# Patient Record
Sex: Male | Born: 1965 | Race: Black or African American | Hispanic: No | Smoking: Never smoker
Health system: Southern US, Community
[De-identification: ages and names within clinical notes are randomized; demographics above are authoritative.]

## PROBLEM LIST (undated history)

## (undated) DIAGNOSIS — J45909 Unspecified asthma, uncomplicated: Secondary | ICD-10-CM

## (undated) DIAGNOSIS — E7800 Pure hypercholesterolemia, unspecified: Secondary | ICD-10-CM

## (undated) DIAGNOSIS — E119 Type 2 diabetes mellitus without complications: Secondary | ICD-10-CM

## (undated) DIAGNOSIS — I10 Essential (primary) hypertension: Secondary | ICD-10-CM

---

## 2007-11-13 ENCOUNTER — Other Ambulatory Visit: Payer: Self-pay

## 2007-11-13 ENCOUNTER — Inpatient Hospital Stay: Payer: Self-pay | Admitting: Internal Medicine

## 2008-06-11 ENCOUNTER — Emergency Department: Payer: Self-pay | Admitting: Emergency Medicine

## 2010-12-14 ENCOUNTER — Emergency Department: Payer: Self-pay | Admitting: Emergency Medicine

## 2011-02-07 ENCOUNTER — Emergency Department: Payer: Self-pay | Admitting: Emergency Medicine

## 2012-05-08 ENCOUNTER — Emergency Department: Payer: Self-pay | Admitting: *Deleted

## 2012-05-08 LAB — BASIC METABOLIC PANEL
Calcium, Total: 8.9 mg/dL (ref 8.5–10.1)
Chloride: 105 mmol/L (ref 98–107)
Co2: 25 mmol/L (ref 21–32)
Glucose: 109 mg/dL — ABNORMAL HIGH (ref 65–99)
Osmolality: 276 (ref 275–301)
Potassium: 3.2 mmol/L — ABNORMAL LOW (ref 3.5–5.1)
Sodium: 138 mmol/L (ref 136–145)

## 2012-05-08 LAB — CBC
HCT: 42.4 % (ref 40.0–52.0)
HGB: 14.5 g/dL (ref 13.0–18.0)
MCHC: 34.2 g/dL (ref 32.0–36.0)
MCV: 91 fL (ref 80–100)
RDW: 13.7 % (ref 11.5–14.5)

## 2012-05-08 LAB — TROPONIN I: Troponin-I: 0.03 ng/mL

## 2012-10-15 ENCOUNTER — Observation Stay: Payer: Self-pay | Admitting: Internal Medicine

## 2012-10-15 LAB — COMPREHENSIVE METABOLIC PANEL
Alkaline Phosphatase: 49 U/L — ABNORMAL LOW (ref 50–136)
Anion Gap: 8 (ref 7–16)
Bilirubin,Total: 0.4 mg/dL (ref 0.2–1.0)
Chloride: 105 mmol/L (ref 98–107)
Creatinine: 1.05 mg/dL (ref 0.60–1.30)
EGFR (African American): >60
Glucose: 111 mg/dL — ABNORMAL HIGH (ref 65–99)
Osmolality: 273 (ref 275–301)
Potassium: 3.8 mmol/L (ref 3.5–5.1)
SGOT(AST): 20 U/L (ref 15–37)
Sodium: 136 mmol/L (ref 136–145)
Total Protein: 7.6 g/dL (ref 6.4–8.2)

## 2012-10-15 LAB — CK TOTAL AND CKMB (NOT AT ARMC)
CK, Total: 216 U/L (ref 35–232)
CK-MB: 0.9 ng/mL (ref 0.5–3.6)
CK-MB: 0.9 ng/mL (ref 0.5–3.6)

## 2012-10-15 LAB — CBC
HCT: 41.6 % (ref 40.0–52.0)
MCV: 92 fL (ref 80–100)
Platelet: 231 10*3/uL (ref 150–440)
RDW: 13.7 % (ref 11.5–14.5)

## 2012-10-15 LAB — TROPONIN I
Troponin-I: 0.02 ng/mL
Troponin-I: 0.03 ng/mL
Troponin-I: 0.03 ng/mL

## 2012-10-15 LAB — LIPID PANEL
Cholesterol: 179 mg/dL (ref 0–200)
Ldl Cholesterol, Calc: 121 mg/dL — ABNORMAL HIGH (ref 0–100)
Triglycerides: 95 mg/dL (ref 0–200)

## 2012-10-15 LAB — PRO B NATRIURETIC PEPTIDE: B-Type Natriuretic Peptide: 35 pg/mL (ref 0–125)

## 2013-10-16 ENCOUNTER — Emergency Department: Payer: Self-pay | Admitting: Emergency Medicine

## 2014-12-24 NOTE — H&P (Signed)
PATIENT NAME:  Bridges, [name] MR#:  [id number] DATE OF BIRTH:  02/09/1966  DATE OF ADMISSION:  10/15/2012  REFERRING PHYSICIAN: [place] SinkJade J. Dolores FrameSung, MD   PRIMARY CARE PHYSICIAN: [facility].   CHIEF COMPLAINT: Chest pain.   HISTORY OF PRESENT ILLNESS: This is a 49 year old male with significant past medical history of diabetes mellitus, on metformin, hypertension, asthma, on p.r.n. albuterol, who presents with complaints of chest pain. The patient reports the other day while he was walking, he had some shortness of breath, which he thought it was related to his asthma, where he took some inhaler, which helped him. He reports at around 2:00 this morning, he had midsternal chest pain, pressure quality, nonradiating, which woke him up from sleep. The patient describes the chest pain as pressure and tightness, nonradiating. Denies any nausea, vomiting, sweating. Reports shortness of breath, mild, accompanied with it, as well reports chest pain is worsened when he takes a deep breath. Denies palpitation, cough, productive sputum, weakness or dizziness. The patient took 324 of aspirin at home. In the ED, the patient had negative troponin, no significant EKG changes. Had some minimal wheezing, which resolved with albuterol treatment. The patient reports family history where his dad had MI at young age, so out of concern of him having unstable angina, the patient was given subcutaneous Lovenox treatment dose in ED, where hospitalist service was requested to admit the patient for further workup of his chest pain   PAST MEDICAL HISTORY:  1. Asthma.  2. Hypertension.  3. Seasonal allergies. 4. Diabetes mellitus.   PAST SURGICAL HISTORY: None.   SOCIAL HISTORY: The patient does not smoke. Drinks beer occasionally.   FAMILY HISTORY: Significant for Alzheimer's for his father as well as history of MI and pacemaker at young age, reports in his 1050s. Also significant for hypertension in his mother.    ALLERGIES: No known drug allergies.   HOME MEDICATIONS:  1. Albuterol as needed.  2. Accupril 10 mg oral daily.  3. Metformin 500 mg 3 tablets daily.  4. Loratadine 10 mg oral daily.  5. Albuterol inhalation every 6 hours as needed.   PHYSICAL EXAMINATION: VITAL SIGNS: Pulse 84, respiratory rate 22, blood pressure 183/110, saturating 94% on room air, last blood pressure 126/71.  GENERAL: Well-nourished male, looks comfortable in bed, in no apparent distress.  HEENT: Head atraumatic, normocephalic. Pupils are equal and reactive to light. Pink conjunctivae. Anicteric sclerae. Moist oral mucosa.  NECK: Supple. No thyromegaly. No JVD.  CHEST: Good air entry bilaterally. No wheezing, rales or rhonchi.  CARDIOVASCULAR: S1, S2 heard. No rubs, murmurs or gallops.  ABDOMEN: Soft, nontender, nondistended. Bowel sounds present.  EXTREMITIES: No edema. No clubbing. No cyanosis.  PSYCHIATRIC: Appropriate affect. Awake and alert x3. Intact judgment and insight.  NEUROLOGIC: Cranial nerves grossly intact. Motor 5 out of 5.  SKIN: Normal skin turgor. Warm and dry. No rash.   PERTINENT LABORATORY: Glucose 111, BUN 13, creatinine 1.05, sodium 136, potassium 3.8, chloride 105, CO2 23, alkaline phosphatase 49, ALT 22, AST 20. Troponin 0.02, CK-MB 0.9, total CK 240. White blood cells 8.9, hemoglobin 14.1, hematocrit 41.6, platelets 231. D-dimer 0.23. EKG showing normal sinus rhythm without any significant ST or T wave changes once compared to old EKGs.   ASSESSMENT AND PLAN:  1. Chest pain. The patient was given subcutaneous Lovenox in the ED out of concern for unstable angina, already took 324 of aspirin at home. The chest pain worsened with deep inspiration, so unlikely cardiac, but  given his risk factors and family history, will cycle his troponins, and if negative, will schedule him for a stress test.  2. Hypertension. Will continue with home medications, currently controlled.  3. Asthma. Has some very  minimal wheezing. Will continue him on p.r.n. albuterol.  4. Diabetes mellitus. Will hold metformin. Will have him on insulin sliding scale.  5. Deep vein thrombosis prophylaxis. Already received treatment dose of Lovenox.  6. Gastrointestinal prophylaxis. Will have him on Protonix.   CODE STATUS: Full code.   TOTAL TIME SPENT ON ADMISSION AND PATIENT CARE: 50 minutes.   ____________________________ Starleen Arms, MD dse:OSi D: 10/15/2012 06:43:16 ET T: 10/15/2012 07:13:46 ET JOB#: [id number]  cc: Starleen Arms, MD, <Dictator> Tresten Pantoja Teena Irani MD ELECTRONICALLY SIGNED 10/18/2012 4:58

## 2014-12-24 NOTE — Discharge Summary (Signed)
PATIENT NAME:  Ronald Bridges, Ronald Bridges MR#:  [id number] DATE OF BIRTH:  [birth year]  DATE OF ADMISSION:  10/15/2012 DATE OF DISCHARGE:  10/15/2012  [facility]   DISCHARGE DIAGNOSES: 1. Chest pain, stress test negative.  2. Hypertension. 3. Diabetes.  4. Gastroesophageal reflux disease.   HISTORY OF PRESENT ILLNESS:  This is a 49 year old male with significant past medical history of diabetes mellitus on metformin, hypertension and asthma, on p.r.n. albuterol, who presented with complaint of chest pain. Per patient's report, while he was walking, he had some shortness of breath. He thought it was related to asthma, took some inhalers which helped him. Reported that around 2 in the morning he had midsternal chest pain, pressure quality and nonradiating which woke him up from sleep. Described the chest pain as pressure and tightness-type, nonradiating. Denied any nausea, vomiting or sweating. Reported shortness of breath, mild, accompanied with it, and pain worsened when he took some deep breaths. Denied any palpitation, cough, productive sputum, weakness or dizziness. He took 324 mg of aspirin at home. In the ER, he had negative troponin and no significant EKG changes, had some minimal wheezing, which resolved with albuterol treatment. The patient had family history of MI.  His father died at young age. So because of concern of having unstable angina, the patient was given subcutaneous Lovenox, treatment dose, in the Emergency Room, and hospitalist service was contacted for further workup. As the patient had not had anything since night, initially the plan was to do the stress test the next day morning, but then the patient was not having any chest pain at that time, and he was n.p.o., so I spoke to the stress test department and they were ready to do it on the same day. We followed his 3 troponins over the next 8 hour period of time and they all remained negative,  0.02, 0.03 and 0.03. Stress test was done on the same day, and I spoke to Dr. Darrold JunkerParaschos on the phone. As per him, the stress test was negative for any ischemia and so we discharged the patient home without any change in his medication, just added some medication for his gastroesophageal reflux disease, which might be possible for his pain.   Other medical issues addressed during this hospital stay:  1. Hypertension. We continued his home medication.  2. Asthma. There is some very minimal wheezing, had some very minimal wheezing for which we continued him on p.r.n. albuterol.  3. Diabetes mellitus. We started on metformin and discharged on the same.   IMPORTANT LAB RESULTS: LDL was slightly elevated at 121. Troponins, all 3, negative.   CONDITION ON DISCHARGE: Stable.   CODE STATUS: FULL CODE.   DISCHARGE MEDICATIONS:  1. Loratadine 10 mg oral tablet once a day. 2. Metformin 500 mg oral tablet once a day. 3. Accupril 10 mg oral tablet once a day. 4. Albuterol inhaler every 6 hours as needed for wheezing.  5. Pantoprazole 40 mg oral delayed-release tablet once a day.   HOME HEALTH ON DISCHARGE: No.   DIET CONSISTENCY ON DISCHARGE: Regular.   DIET ON DISCHARGE: Low sodium, low carbohydrate-controlled diet.  TIMEFRAME TO FOLLOWUP: In 1 to 2 weeks and follow up with PMD as routine.   TOTAL TIME SPENT IN THIS DISCHARGE: 45 minutes. ____________________________ [name], MD vgv:sb D: 10/19/2012 14:09:26 ET T: 10/20/2012 08:54:34 ET JOB#: [id number]  cc: [name], MD, <Dictator> [facility] Altamese DillingVAIBHAVKUMAR Elige Shouse MD  ELECTRONICALLY SIGNED 10/24/2012 18:19

## 2015-07-31 ENCOUNTER — Emergency Department
Admission: EM | Admit: 2015-07-31 | Discharge: 2015-07-31 | Disposition: A | Discharge status: Home / Self Care | Payer: Self-pay | Attending: Emergency Medicine | Admitting: Emergency Medicine

## 2015-07-31 ENCOUNTER — Encounter: Payer: Self-pay | Admitting: Emergency Medicine

## 2015-07-31 DIAGNOSIS — E119 Type 2 diabetes mellitus without complications: Secondary | ICD-10-CM | POA: Insufficient documentation

## 2015-07-31 DIAGNOSIS — K029 Dental caries, unspecified: Secondary | ICD-10-CM | POA: Insufficient documentation

## 2015-07-31 DIAGNOSIS — K047 Periapical abscess without sinus: Secondary | ICD-10-CM | POA: Insufficient documentation

## 2015-07-31 DIAGNOSIS — I10 Essential (primary) hypertension: Secondary | ICD-10-CM | POA: Insufficient documentation

## 2015-07-31 HISTORY — DX: Essential (primary) hypertension: I10

## 2015-07-31 HISTORY — DX: Unspecified asthma, uncomplicated: J45.909

## 2015-07-31 HISTORY — DX: Pure hypercholesterolemia, unspecified: E78.00

## 2015-07-31 HISTORY — DX: Type 2 diabetes mellitus without complications: E11.9

## 2015-07-31 MED ORDER — AMOXICILLIN 875 MG PO TABS: 875.0000 mg | ORAL_TABLET | Freq: Two times a day (BID) | ORAL | Status: DC

## 2015-07-31 MED ORDER — MAGIC MOUTHWASH W/LIDOCAINE: 5.0000 mL | Freq: Four times a day (QID) | ORAL | Status: DC

## 2015-07-31 MED ORDER — HYDROCODONE-ACETAMINOPHEN 5-325 MG PO TABS: 1.0000 | ORAL_TABLET | ORAL | Status: DC | PRN

## 2015-07-31 NOTE — Discharge Instructions (Signed)

## 2015-07-31 NOTE — ED Notes (Signed)
Patient presents to the ED with upper left sided tooth pain and swelling to the left side of his face.  Patient denies nausea and vomiting at this time.  Patient states pain started on Friday.  Patient denies any broken teeth to his knowledge.

## 2015-07-31 NOTE — ED Notes (Signed)
Patient states that he is having pain and swelling on the right side of his mouth for 3 days.

## 2015-07-31 NOTE — ED Provider Notes (Signed)
[facility] Emergency Department Provider Note  ____________________________________________  Time seen: Approximately 9:22 AM  I have reviewed the triage vital signs and the nursing notes.   HISTORY  Chief Complaint Dental Pain    HPI Ronald Bridges is a 49 y.o. male resents emergency department complaining of left upper jaw/tooth pain and swelling for 3 days. He states that symptoms began insidiously and has increased significantly over the last 24 hours. He denies any dental trauma that he knows of. He does endorse some poor dentition to include multiple cavities. Patient denies any fevers or chills, nausea or vomiting, difficulty breathing or swallowing. He states the pain is moderate to severe, constant, worse with mastication area over the counter medications have not alleviated same.   Past Medical History  Diagnosis Date  . Asthma   . Diabetes mellitus without complication (HCC)   . Hypertension   . High cholesterol     There are no active problems to display for this patient.   History reviewed. No pertinent past surgical history.  Current Outpatient Rx  Name  Route  Sig  Dispense  Refill  . amoxicillin (AMOXIL) 875 MG tablet   Oral   Take 1 tablet (875 mg total) by mouth 2 (two) times daily.   14 tablet   0   . HYDROcodone-acetaminophen (NORCO/VICODIN) 5-325 MG tablet   Oral   Take 1 tablet by mouth every 4 (four) hours as needed for moderate pain.   10 tablet   0   . magic mouthwash w/lidocaine SOLN   Oral   Take 5 mLs by mouth 4 (four) times daily.   240 mL   0     Dispense in a 1/1/1/1 ratio     Allergies Review of patient's allergies indicates no known allergies.  No family history on file.  Social History Social History  Substance Use Topics  . Smoking status: Never Smoker   . Smokeless tobacco: None  . Alcohol Use: No    Review of Systems Constitutional: No fever/chills Eyes: No visual changes. ENT: No  sore throat. Cardiovascular: Denies chest pain. Respiratory: Denies shortness of breath. Gastrointestinal: No abdominal pain.  No nausea, no vomiting.  No diarrhea.  No constipation. Genitourinary: Negative for dysuria. Musculoskeletal: Negative for back pain. Skin: Negative for rash. Neurological: Negative for headaches, focal weakness or numbness.  10-point ROS otherwise negative.  ____________________________________________   PHYSICAL EXAM:  VITAL SIGNS: ED Triage Vitals  Enc Vitals Group     BP 07/31/15 0915 168/95 mmHg     Pulse Rate 07/31/15 0915 86     Resp 07/31/15 0915 18     Temp 07/31/15 0915 98.9 F (37.2 C)     Temp Source 07/31/15 0915 Oral     SpO2 07/31/15 0915 96 %     Weight 07/31/15 0915 280 lb (127.007 kg)     Height 07/31/15 0915 6\' 4"  (1.93 m)     Head Cir --      Peak Flow --      Pain Score 07/31/15 0918 8     Pain Loc --      Pain Edu? --      Excl. in GC? --     Constitutional: Alert and oriented. Well appearing and in no acute distress. Eyes: Conjunctivae are normal. PERRL. EOMI. Head: Atraumatic. Nose: No congestion/rhinnorhea. Mouth/Throat: Mucous membranes are moist.  Oropharynx non-erythematous. Dental caries are observed and multiple teeth. Surrounding erythema and edema to the first and  second molars on the left upper dentition. Extreme tenderness to palpation with tongue depressor. No fluctuance with palpation. No drainage noted. Neck: No stridor.   Hematological/Lymphatic/Immunilogical: No cervical lymphadenopathy. Cardiovascular: Normal rate, regular rhythm. Grossly normal heart sounds.  Good peripheral circulation. Respiratory: Normal respiratory effort.  No retractions. Lungs CTAB. Gastrointestinal: Soft and nontender. No distention. No abdominal bruits. No CVA tenderness. Musculoskeletal: No lower extremity tenderness nor edema.  No joint effusions. Neurologic:  Normal speech and language. No gross focal neurologic deficits are  appreciated. No gait instability. Skin:  Skin is warm, dry and intact. No rash noted. Psychiatric: Mood and affect are normal. Speech and behavior are normal.  ____________________________________________   LABS (all labs ordered are listed, but only abnormal results are displayed)  Labs Reviewed - No data to display ____________________________________________  EKG   ____________________________________________  RADIOLOGY   ____________________________________________   PROCEDURES  Procedure(s) performed: None  Critical Care performed: No  ____________________________________________   INITIAL IMPRESSION / ASSESSMENT AND PLAN / ED COURSE  Pertinent labs & imaging results that were available during my care of the patient were reviewed by me and considered in my medical decision making (see chart for details).  Patient's history, symptoms, physical exam are consistent with dental abscess to the left upper dentition. Advised patient of findings and diagnosis and he verbalizes understanding of same. The patient was placed on amoxicillin, Norco, and magic mouthwash for symptomatic control. Advised the patient to follow-up with dentist for any further evaluation and treatment. Patient verbalizes understanding of same and verbalizes compliance with treatment plan. ____________________________________________   FINAL CLINICAL IMPRESSION(S) / ED DIAGNOSES  Final diagnoses:  Dental abscess      Racheal Patches, PA-C 07/31/15 0935  Phineas Semen, MD 07/31/15 [id number]

## 2016-03-18 ENCOUNTER — Emergency Department
Admission: EM | Admit: 2016-03-18 | Discharge: 2016-03-18 | Disposition: A | Discharge status: Home / Self Care | Payer: Self-pay | Attending: Emergency Medicine | Admitting: Emergency Medicine

## 2016-03-18 ENCOUNTER — Emergency Department: Payer: Self-pay

## 2016-03-18 ENCOUNTER — Encounter: Payer: Self-pay | Admitting: Emergency Medicine

## 2016-03-18 DIAGNOSIS — Y999 Unspecified external cause status: Secondary | ICD-10-CM | POA: Insufficient documentation

## 2016-03-18 DIAGNOSIS — Y9389 Activity, other specified: Secondary | ICD-10-CM | POA: Insufficient documentation

## 2016-03-18 DIAGNOSIS — I10 Essential (primary) hypertension: Secondary | ICD-10-CM | POA: Insufficient documentation

## 2016-03-18 DIAGNOSIS — W268XXA Contact with other sharp object(s), not elsewhere classified, initial encounter: Secondary | ICD-10-CM | POA: Insufficient documentation

## 2016-03-18 DIAGNOSIS — Z7984 Long term (current) use of oral hypoglycemic drugs: Secondary | ICD-10-CM | POA: Insufficient documentation

## 2016-03-18 DIAGNOSIS — S61218A Laceration without foreign body of other finger without damage to nail, initial encounter: Secondary | ICD-10-CM

## 2016-03-18 DIAGNOSIS — S61212A Laceration without foreign body of right middle finger without damage to nail, initial encounter: Secondary | ICD-10-CM | POA: Insufficient documentation

## 2016-03-18 DIAGNOSIS — Z79899 Other long term (current) drug therapy: Secondary | ICD-10-CM | POA: Insufficient documentation

## 2016-03-18 DIAGNOSIS — J45909 Unspecified asthma, uncomplicated: Secondary | ICD-10-CM | POA: Insufficient documentation

## 2016-03-18 DIAGNOSIS — E119 Type 2 diabetes mellitus without complications: Secondary | ICD-10-CM | POA: Insufficient documentation

## 2016-03-18 DIAGNOSIS — IMO0001 Reserved for inherently not codable concepts without codable children: Secondary | ICD-10-CM

## 2016-03-18 DIAGNOSIS — Y929 Unspecified place or not applicable: Secondary | ICD-10-CM | POA: Insufficient documentation

## 2016-03-18 MED ORDER — LIDOCAINE HCL (PF) 1 % IJ SOLN
2.0000 mL | Freq: Once | INTRAMUSCULAR | Status: AC
  Administered 2016-03-18: 2 mL

## 2016-03-18 MED ORDER — HYDROCODONE-ACETAMINOPHEN 5-325 MG PO TABS: 1.0000 | ORAL_TABLET | Freq: Four times a day (QID) | ORAL | Status: DC | PRN

## 2016-03-18 MED ORDER — CEPHALEXIN 500 MG PO CAPS
500.0000 mg | ORAL_CAPSULE | Freq: Once | ORAL | Status: AC
  Administered 2016-03-18: 500 mg via ORAL
  Filled 2016-03-18: qty 1

## 2016-03-18 MED ORDER — CEPHALEXIN 500 MG PO CAPS: 500.0000 mg | ORAL_CAPSULE | Freq: Four times a day (QID) | ORAL | Status: AC

## 2016-03-18 MED ORDER — TETANUS-DIPHTH-ACELL PERTUSSIS 5-2.5-18.5 LF-MCG/0.5 IM SUSP
0.5000 mL | Freq: Once | INTRAMUSCULAR | Status: AC
  Administered 2016-03-18: 0.5 mL via INTRAMUSCULAR
  Filled 2016-03-18: qty 0.5

## 2016-03-18 NOTE — Discharge Instructions (Signed)
Laceration Care, Adult  A laceration is a cut that goes through all layers of the skin. The cut also goes into the tissue that is right under the skin. Some cuts heal on their own. Others need to be closed with stitches (sutures), staples, skin adhesive strips, or wound glue. Taking care of your cut lowers your risk of infection and helps your cut to heal better.  HOW TO TAKE CARE OF YOUR CUT  For stitches or staples:  · Keep the wound clean and dry.  · If you were given a bandage (dressing), you should change it at least one time per day or as told by your doctor. You should also change it if it gets wet or dirty.  · Keep the wound completely dry for the first 24 hours or as told by your doctor. After that time, you may take a shower or a bath. However, make sure that the wound is not soaked in water until after the stitches or staples have been removed.  · Clean the wound one time each day or as told by your doctor:    Wash the wound with soap and water.    Rinse the wound with water until all of the soap comes off.    Pat the wound dry with a clean towel. Do not rub the wound.  · After you clean the wound, put a thin layer of antibiotic ointment on it as told by your doctor. This ointment:    Helps to prevent infection.    Keeps the bandage from sticking to the wound.  · Have your stitches or staples removed as told by your doctor.  If your doctor used skin adhesive strips:   · Keep the wound clean and dry.  · If you were given a bandage, you should change it at least one time per day or as told by your doctor. You should also change it if it gets dirty or wet.  · Do not get the skin adhesive strips wet. You can take a shower or a bath, but be careful to keep the wound dry.  · If the wound gets wet, pat it dry with a clean towel. Do not rub the wound.  · Skin adhesive strips fall off on their own. You can trim the strips as the wound heals. Do not remove any strips that are still stuck to the wound. They will  fall off after a while.  If your doctor used wound glue:  · Try to keep your wound dry, but you may briefly wet it in the shower or bath. Do not soak the wound in water, such as by swimming.  · After you take a shower or a bath, gently pat the wound dry with a clean towel. Do not rub the wound.  · Do not do any activities that will make you really sweaty until the skin glue has fallen off on its own.  · Do not apply liquid, cream, or ointment medicine to your wound while the skin glue is still on.  · If you were given a bandage, you should change it at least one time per day or as told by your doctor. You should also change it if it gets dirty or wet.  · If a bandage is placed over the wound, do not let the tape for the bandage touch the skin glue.  · Do not pick at the glue. The skin glue usually stays on for 5-10 days. Then, it   or when wound glue stays in place and the wound is healed. Make sure to wear a sunscreen of at least 30 SPF.  Take over-the-counter and prescription medicines only as told by your doctor.  If you were given antibiotic medicine or ointment, take or apply it as told by your doctor. Do not stop using the antibiotic even if your wound is getting better.  Do not scratch or pick at the wound.  Keep all follow-up visits as told by your doctor. This is important.  Check your wound every day for signs of infection. Watch for:  Redness, swelling, or pain.  Fluid, blood, or pus.  Raise (elevate) the injured area above the level of your heart while you are sitting or lying down, if possible. GET HELP IF:  You got a tetanus shot and you have any of these problems at the injection site:  Swelling.  Very bad pain.  Redness.  Bleeding.  You have a fever.  A wound that was  closed breaks open.  You notice a bad smell coming from your wound or your bandage.  You notice something coming out of the wound, such as wood or glass.  Medicine does not help your pain.  You have more redness, swelling, or pain at the site of your wound.  You have fluid, blood, or pus coming from your wound.  You notice a change in the color of your skin near your wound.  You need to change the bandage often because fluid, blood, or pus is coming from the wound.  You start to have a new rash.  You start to have numbness around the wound. GET HELP RIGHT AWAY IF:  You have very bad swelling around the wound.  Your pain suddenly gets worse and is very bad.  You notice painful lumps near the wound or on skin that is anywhere on your body.  You have a red streak going away from your wound.  The wound is on your hand or foot and you cannot move a finger or toe like you usually can.  The wound is on your hand or foot and you notice that your fingers or toes look pale or bluish.   This information is not intended to replace advice given to you by your health care provider. Make sure you discuss any questions you have with your health care provider.   Document Released: 02/06/2008 Document Revised: 01/04/2015 Document Reviewed: 08/16/2014 Elsevier Interactive Patient Education 2016 [employer]   Please keep the incision site clean and dry for the next 48 hours. Change dressing as needed every 24 hours after the first 2 days. Soak in half warm water, half peroxide daily. Take antibiotics as prescribed. Follow-up in 8-10 days for suture removal.

## 2016-03-18 NOTE — ED Notes (Signed)
Patient presents to the ED with a laceration to his right forefinger.  Patient states, "I wasn't thinking and I tried to get a piece of grass out of the lawnmower with my finger.  Area is slowly bleeding at this time.  Patient is in no obvious distress at this time.

## 2016-03-18 NOTE — ED Provider Notes (Signed)
CSN: [id number]     Arrival date & time 03/18/16  1250 History   First MD Initiated Contact with Patient 03/18/16 1337     Chief Complaint  Patient presents with  . Extremity Laceration     (Consider location/radiation/quality/duration/timing/severity/associated sxs/prior Treatment) HPI  50 year old male presents to emergency department for evaluation of right index finger laceration. Patient states just prior to arrival he was cleaning out his lawnmower while the blade was still running, the bleeding cut the tip of his right middle finger. Patient suffered 2 lacerations to the distal phalanx. Patient's pain is moderate. No numbness or tingling. Tetanus is not up-to-date. He is not had any medications for pain.  Past Medical History  Diagnosis Date  . Asthma   . Diabetes mellitus without complication (HCC)   . Hypertension   . High cholesterol    History reviewed. No pertinent past surgical history. No family history on file. Social History  Substance Use Topics  . Smoking status: Never Smoker   . Smokeless tobacco: None  . Alcohol Use: No    Review of Systems  Constitutional: Negative.  Negative for fever, chills, activity change and appetite change.  HENT: Negative for congestion, ear pain, mouth sores, rhinorrhea, sinus pressure, sore throat and trouble swallowing.   Eyes: Negative for photophobia, pain and discharge.  Respiratory: Negative for cough, chest tightness and shortness of breath.   Cardiovascular: Negative for chest pain and leg swelling.  Gastrointestinal: Negative for nausea, vomiting, abdominal pain, diarrhea and abdominal distention.  Genitourinary: Negative for dysuria and difficulty urinating.  Musculoskeletal: Negative for back pain, arthralgias and gait problem.  Skin: Positive for wound. Negative for color change and rash.  Neurological: Negative for dizziness and headaches.  Hematological: Negative for adenopathy.  Psychiatric/Behavioral: Negative for  behavioral problems and agitation.      Allergies  Review of patient's allergies indicates no known allergies.  Home Medications   Prior to Admission medications   Medication Sig Start Date End Date Taking? Authorizing Provider  albuterol (PROVENTIL HFA;VENTOLIN HFA) 108 (90 Base) MCG/ACT inhaler Inhale 2 puffs into the lungs every 6 (six) hours as needed for wheezing or shortness of breath.   Yes Historical Provider, MD  atorvastatin (LIPITOR) 20 MG tablet Take 20 mg by mouth daily.   Yes Historical Provider, MD  beclomethasone (QVAR) 80 MCG/ACT inhaler Inhale into the lungs 2 (two) times daily.   Yes Historical Provider, MD  glipiZIDE (GLUCOTROL) 10 MG tablet Take 10 mg by mouth daily before breakfast.   Yes Historical Provider, MD  metFORMIN (GLUCOPHAGE) 500 MG tablet Take by mouth 2 (two) times daily with a meal.   Yes Historical Provider, MD  montelukast (SINGULAIR) 10 MG tablet Take 10 mg by mouth at bedtime.   Yes Historical Provider, MD  amoxicillin (AMOXIL) 875 MG tablet Take 1 tablet (875 mg total) by mouth 2 (two) times daily. 07/31/15   Delorise Royals Cuthriell, PA-C  cephALEXin (KEFLEX) 500 MG capsule Take 1 capsule (500 mg total) by mouth 4 (four) times daily. 7 days 03/18/16 03/28/16  Evon Slack, PA-C  HYDROcodone-acetaminophen (NORCO) 5-325 MG tablet Take 1 tablet by mouth every 6 (six) hours as needed for moderate pain. 03/18/16   Evon Slack, PA-C  HYDROcodone-acetaminophen (NORCO/VICODIN) 5-325 MG tablet Take 1 tablet by mouth every 4 (four) hours as needed for moderate pain. 07/31/15   Delorise Royals Cuthriell, PA-C  magic mouthwash w/lidocaine SOLN Take 5 mLs by mouth 4 (four) times daily. 07/31/15  Delorise Royals Cuthriell, PA-C   BP 150/94 mmHg  Pulse 93  Temp(Src) 98.2 F (36.8 C) (Oral)  Resp 16  Ht 6\' 4"  (1.93 m)  Wt 124.739 kg  BMI 33.49 kg/m2  SpO2 95% Physical Exam  Constitutional: He is oriented to person, place, and time. He appears well-developed and  well-nourished.  HENT:  Head: Normocephalic and atraumatic.  Eyes: Conjunctivae and EOM are normal. Pupils are equal, round, and reactive to light.  Neck: Normal range of motion. Neck supple.  Cardiovascular: Normal rate, regular rhythm, normal heart sounds and intact distal pulses.   Pulmonary/Chest: Effort normal and breath sounds normal. No respiratory distress. He has no wheezes. He has no rales. He exhibits no tenderness.  Abdominal: Soft. Bowel sounds are normal. He exhibits no distension. There is no tenderness.  Musculoskeletal:  Examination of the right hand shows the patient has lacerations to the tip of the right middle finger along the distal phalanx. Patient has a volar and dorsal laceration, approximately 2 cm in length each. There is no sign of foreign body. Wound was thoroughly irrigated with Betadine and saline. Patient has sensation that is intact distally on the ulnar and radial aspect of the finger. Extensor and flexor tendons are intact at the DIP and PIP joints. Nail is intact.  Neurological: He is alert and oriented to person, place, and time.  Skin: Skin is warm and dry.  Psychiatric: He has a normal mood and affect. His behavior is normal. Judgment and thought content normal.    ED Course  Procedures (including critical care time) LACERATION REPAIR Performed by: Patience Musca Authorized by: Patience Musca Consent: Verbal consent obtained. Risks and benefits: risks, benefits and alternatives were discussed Consent given by: patient Patient identity confirmed: provided demographic data Prepped and Draped in normal sterile fashion Wound explored  Laceration Location: Right middle finger, distal phalanx  Laceration Length: 4 cm  No Foreign Bodies seen or palpated  Anesthesia: local infiltration  Local anesthetic: lidocaine 1 % without epinephrine  Anesthetic total: 2 ml digital block procedure   Irrigation method: syringe Amount of  cleaning: standard  Skin closure: Simple interrupted 5-0 nylon   Number of sutures: #10 sutures   Technique: Simple interrupted   Patient tolerance: Patient tolerated the procedure well with no immediate complications. Labs Review Labs Reviewed - No data to display  Imaging Review Dg Finger Middle Right  03/18/2016  CLINICAL DATA:  Laceration it tip of middle finger. EXAM: RIGHT MIDDLE FINGER 2+V COMPARISON:  None. FINDINGS: Three-view exam shows radiopaque debris along the dorsal cortex of the middle finger distal phalanx. This could be related to cortical bone injury or retained radiopaque soft tissue foreign bodies. No gross fracture is evident. IMPRESSION: Tiny radiopaque material along the dorsal aspect of the distal middle finger is probably related to the degree of cortical disruption related to the laceration although retained radiopaque soft tissue foreign body could also have this appearance. Electronically Signed   By: Kennith Center M.D.   On: 03/18/2016 14:12   I have personally reviewed and evaluated these images and lab results as part of my medical decision-making.   EKG Interpretation None      MDM   Final diagnoses:  Laceration of middle finger, initial encounter    50 year old male with laceration to the right middle finger, distal phalanx. No neurovascular or tendon deficits noted. X-ray show no fracture, small radiopaque foreign body was removed during laceration repair. Sterile dressing was applied, patient  will follow-up with orthopedics in 8-10 days for suture removal. He is placed on Keflex. Tetanus was updated.    Evon Slackhomas C Gaines, PA-C 03/18/16 1451  Sharyn CreamerMark Quale, MD 03/18/16 [phone]

## 2016-03-18 NOTE — ED Notes (Signed)
Right index finger placed in betadine soak

## 2016-03-18 NOTE — ED Notes (Signed)
Laceration noted to right index finger from lawnmower  States he tried to remove grass while  was moving

## 2016-05-24 ENCOUNTER — Emergency Department
Admission: EM | Admit: 2016-05-24 | Discharge: 2016-05-24 | Disposition: A | Discharge status: Home / Self Care | Payer: Worker's Compensation | Attending: Emergency Medicine | Admitting: Emergency Medicine

## 2016-05-24 ENCOUNTER — Encounter: Payer: Self-pay | Admitting: Emergency Medicine

## 2016-05-24 DIAGNOSIS — Y92199 Unspecified place in other specified residential institution as the place of occurrence of the external cause: Secondary | ICD-10-CM | POA: Insufficient documentation

## 2016-05-24 DIAGNOSIS — T148XXA Other injury of unspecified body region, initial encounter: Secondary | ICD-10-CM

## 2016-05-24 DIAGNOSIS — S50812A Abrasion of left forearm, initial encounter: Secondary | ICD-10-CM | POA: Insufficient documentation

## 2016-05-24 DIAGNOSIS — E119 Type 2 diabetes mellitus without complications: Secondary | ICD-10-CM | POA: Diagnosis not present

## 2016-05-24 DIAGNOSIS — I10 Essential (primary) hypertension: Secondary | ICD-10-CM | POA: Insufficient documentation

## 2016-05-24 DIAGNOSIS — S50811A Abrasion of right forearm, initial encounter: Secondary | ICD-10-CM | POA: Insufficient documentation

## 2016-05-24 DIAGNOSIS — Z7984 Long term (current) use of oral hypoglycemic drugs: Secondary | ICD-10-CM | POA: Diagnosis not present

## 2016-05-24 DIAGNOSIS — J45909 Unspecified asthma, uncomplicated: Secondary | ICD-10-CM | POA: Diagnosis not present

## 2016-05-24 DIAGNOSIS — S20311A Abrasion of right front wall of thorax, initial encounter: Secondary | ICD-10-CM | POA: Diagnosis not present

## 2016-05-24 DIAGNOSIS — Y990 Civilian activity done for income or pay: Secondary | ICD-10-CM | POA: Diagnosis not present

## 2016-05-24 DIAGNOSIS — S59912A Unspecified injury of left forearm, initial encounter: Secondary | ICD-10-CM | POA: Diagnosis present

## 2016-05-24 DIAGNOSIS — Y939 Activity, unspecified: Secondary | ICD-10-CM | POA: Diagnosis not present

## 2016-05-24 MED ORDER — SULFAMETHOXAZOLE-TRIMETHOPRIM 800-160 MG PO TABS
1.0000 | ORAL_TABLET | Freq: Once | ORAL | Status: AC
  Administered 2016-05-24: 1 via ORAL
  Filled 2016-05-24: qty 1

## 2016-05-24 MED ORDER — NEOMYCIN-POLYMYXIN-PRAMOXINE 1 % EX CREA: TOPICAL_CREAM | Freq: Two times a day (BID) | CUTANEOUS | 0 refills | Status: AC

## 2016-05-24 MED ORDER — SULFAMETHOXAZOLE-TRIMETHOPRIM 800-160 MG PO TABS: 1.0000 | ORAL_TABLET | Freq: Two times a day (BID) | ORAL | 0 refills | Status: DC

## 2016-05-24 MED ORDER — NAPROXEN 500 MG PO TABS: 500.0000 mg | ORAL_TABLET | Freq: Two times a day (BID) | ORAL | Status: DC

## 2016-05-24 MED ORDER — BACITRACIN ZINC 500 UNIT/GM EX OINT
TOPICAL_OINTMENT | Freq: Two times a day (BID) | CUTANEOUS | Status: DC
  Administered 2016-05-24: 1 via TOPICAL
  Filled 2016-05-24: qty 0.9

## 2016-05-24 MED ORDER — NAPROXEN 500 MG PO TABS
500.0000 mg | ORAL_TABLET | Freq: Once | ORAL | Status: AC
  Administered 2016-05-24: 500 mg via ORAL
  Filled 2016-05-24: qty 1
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
## 2016-05-24 NOTE — ED Notes (Signed)
Workmans Comp done. UDS completed and walked specimen to the lab. Breathe Analysis was only upon request. Spoke to Merla RichesJudy Craig from [employer] and said we did not have to do it.
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
## 2016-05-24 NOTE — ED Notes (Signed)
Pt arms wrapped and bacitracin applied.

## 2016-10-07 ENCOUNTER — Emergency Department: Payer: Self-pay

## 2016-10-07 ENCOUNTER — Emergency Department
Admission: EM | Admit: 2016-10-07 | Discharge: 2016-10-07 | Disposition: A | Discharge status: Home / Self Care | Payer: Self-pay | Attending: Emergency Medicine | Admitting: Emergency Medicine

## 2016-10-07 ENCOUNTER — Encounter: Payer: Self-pay | Admitting: Emergency Medicine

## 2016-10-07 DIAGNOSIS — J45901 Unspecified asthma with (acute) exacerbation: Secondary | ICD-10-CM | POA: Insufficient documentation

## 2016-10-07 DIAGNOSIS — J101 Influenza due to other identified influenza virus with other respiratory manifestations: Secondary | ICD-10-CM | POA: Insufficient documentation

## 2016-10-07 DIAGNOSIS — I10 Essential (primary) hypertension: Secondary | ICD-10-CM | POA: Insufficient documentation

## 2016-10-07 DIAGNOSIS — E119 Type 2 diabetes mellitus without complications: Secondary | ICD-10-CM | POA: Insufficient documentation

## 2016-10-07 DIAGNOSIS — Z7984 Long term (current) use of oral hypoglycemic drugs: Secondary | ICD-10-CM | POA: Insufficient documentation

## 2016-10-07 DIAGNOSIS — Z79899 Other long term (current) drug therapy: Secondary | ICD-10-CM | POA: Insufficient documentation

## 2016-10-07 LAB — URINALYSIS, COMPLETE (UACMP) WITH MICROSCOPIC
Bacteria, UA: NONE SEEN
Bilirubin Urine: NEGATIVE
GLUCOSE, UA: NEGATIVE mg/dL
HGB URINE DIPSTICK: NEGATIVE
KETONES UR: NEGATIVE mg/dL
Leukocytes, UA: NEGATIVE
Nitrite: NEGATIVE
PROTEIN: 30 mg/dL — AB
Specific Gravity, Urine: 1.02 (ref 1.005–1.030)
Squamous Epithelial / LPF: NONE SEEN
pH: 6 (ref 5.0–8.0)

## 2016-10-07 LAB — BASIC METABOLIC PANEL
ANION GAP: 8 (ref 5–15)
BUN: 12 mg/dL (ref 6–20)
CALCIUM: 9.4 mg/dL (ref 8.9–10.3)
CO2: 25 mmol/L (ref 22–32)
CREATININE: 1.06 mg/dL (ref 0.61–1.24)
Chloride: 104 mmol/L (ref 101–111)
Glucose, Bld: 89 mg/dL (ref 65–99)
Potassium: 3.8 mmol/L (ref 3.5–5.1)
SODIUM: 137 mmol/L (ref 135–145)

## 2016-10-07 LAB — TROPONIN I: Troponin I: <0.03 ng/mL (ref <0.03)

## 2016-10-07 LAB — INFLUENZA PANEL BY PCR (TYPE A & B)
INFLBPCR: POSITIVE — AB
Influenza A By PCR: NEGATIVE

## 2016-10-07 LAB — CBC
HCT: 39.8 % — ABNORMAL LOW (ref 40.0–52.0)
HEMOGLOBIN: 13.3 g/dL (ref 13.0–18.0)
MCH: 30 pg (ref 26.0–34.0)
MCHC: 33.5 g/dL (ref 32.0–36.0)
MCV: 89.6 fL (ref 80.0–100.0)
PLATELETS: 203 10*3/uL (ref 150–440)
RBC: 4.44 MIL/uL (ref 4.40–5.90)
RDW: 13.9 % (ref 11.5–14.5)
WBC: 12.5 10*3/uL — ABNORMAL HIGH (ref 3.8–10.6)

## 2016-10-07 MED ORDER — ONDANSETRON HCL 4 MG/2ML IJ SOLN
4.0000 mg | Freq: Once | INTRAMUSCULAR | Status: AC
  Administered 2016-10-07: 4 mg via INTRAVENOUS
  Filled 2016-10-07: qty 2

## 2016-10-07 MED ORDER — OSELTAMIVIR PHOSPHATE 75 MG PO CAPS: 75.0000 mg | ORAL_CAPSULE | Freq: Two times a day (BID) | ORAL | 0 refills | Status: AC

## 2016-10-07 MED ORDER — IPRATROPIUM-ALBUTEROL 0.5-2.5 (3) MG/3ML IN SOLN
3.0000 mL | Freq: Once | RESPIRATORY_TRACT | Status: AC
  Administered 2016-10-07: 3 mL via RESPIRATORY_TRACT
  Filled 2016-10-07: qty 3

## 2016-10-07 MED ORDER — ACETAMINOPHEN 500 MG PO TABS
1000.0000 mg | ORAL_TABLET | Freq: Once | ORAL | Status: AC
  Administered 2016-10-07: 1000 mg via ORAL
  Filled 2016-10-07: qty 2

## 2016-10-07 MED ORDER — ALBUTEROL SULFATE (2.5 MG/3ML) 0.083% IN NEBU: 2.5000 mg | INHALATION_SOLUTION | Freq: Four times a day (QID) | RESPIRATORY_TRACT | 2 refills | Status: DC | PRN

## 2016-10-07 MED ORDER — METHYLPREDNISOLONE SODIUM SUCC 125 MG IJ SOLR
125.0000 mg | Freq: Once | INTRAMUSCULAR | Status: AC
  Administered 2016-10-07: 125 mg via INTRAVENOUS
  Filled 2016-10-07: qty 2

## 2016-10-07 MED ORDER — IPRATROPIUM-ALBUTEROL 0.5-2.5 (3) MG/3ML IN SOLN
3.0000 mL | Freq: Once | RESPIRATORY_TRACT | Status: AC
Start: 2016-10-07 — End: 2016-10-07
  Administered 2016-10-07: 3 mL via RESPIRATORY_TRACT
  Filled 2016-10-07: qty 3

## 2016-10-07 MED ORDER — ALBUTEROL SULFATE HFA 108 (90 BASE) MCG/ACT IN AERS: 2.0000 | INHALATION_SPRAY | Freq: Four times a day (QID) | RESPIRATORY_TRACT | 2 refills | Status: DC | PRN

## 2016-10-07 MED ORDER — PREDNISONE 20 MG PO TABS: 60.0000 mg | ORAL_TABLET | Freq: Every day | ORAL | 0 refills | Status: AC

## 2016-10-07 MED ORDER — SODIUM CHLORIDE 0.9 % IV BOLUS (SEPSIS)
1000.0000 mL | Freq: Once | INTRAVENOUS | Status: AC
  Administered 2016-10-07: 1000 mL via INTRAVENOUS

## 2016-10-07 MED ORDER — OSELTAMIVIR PHOSPHATE 75 MG PO CAPS
75.0000 mg | ORAL_CAPSULE | Freq: Once | ORAL | Status: AC
Start: 2016-10-07 — End: 2016-10-07
  Administered 2016-10-07: 75 mg via ORAL
  Filled 2016-10-07: qty 1

## 2016-10-07 NOTE — ED Notes (Addendum)
Pt stating that her sx started last Monday. Pt stating " I kept getting worse and worse." Pt stating SOB with walking, pt stating his inhaler hasn't been really effective. Pt stating body aches. Pt in NAD at this time. Pt on cardiac monitor and pulse o2. Pt stating that he has had a dry cough and he has had nausea. Pt denying any vomiting or diarrhea.

## 2016-10-07 NOTE — ED Provider Notes (Signed)
[facility] Emergency Department Provider Note  ____________________________________________  Time seen: Approximately 3:47 PM  I have reviewed the triage vital signs and the nursing notes.   HISTORY  Chief Complaint Generalized Body Aches; Cough; and Chills   HPI Ronald Bridges is a 51 y.o. male the history of asthma, diabetes, hypertension, and hyperlipidemia who presents for evaluation of flulike symptoms. Patient reports a week of body aches, shortness of breath, wheezing, cough productive of yellow sputum, chills, and fever since yesterday. He endorses nausea as well and dizziness especially when he stands up for the last few days. He has been using his inhalers around-the-clock. Endorses mild headache and a sore throat. He is not a smoker. He denies chest pain. Patient did not receive his flu shot this season. No vomiting or diarrhea, no abdominal pain, no rash, no neck stiffness.  Past Medical History:  Diagnosis Date  . Asthma   . Diabetes mellitus without complication (HCC)   . High cholesterol   . Hypertension     There are no active problems to display for this patient.   History reviewed. No pertinent surgical history.  Prior to Admission medications   Medication Sig Start Date End Date Taking? Authorizing Provider  albuterol (PROVENTIL HFA;VENTOLIN HFA) 108 (90 Base) MCG/ACT inhaler Inhale 2 puffs into the lungs every 6 (six) hours as needed for wheezing or shortness of breath.    Historical Provider, MD  albuterol (PROVENTIL HFA;VENTOLIN HFA) 108 (90 Base) MCG/ACT inhaler Inhale 2 puffs into the lungs every 6 (six) hours as needed for wheezing or shortness of breath. 10/07/16   Nita Sickle, MD  albuterol (PROVENTIL) (2.5 MG/3ML) 0.083% nebulizer solution Take 3 mLs (2.5 mg total) by nebulization every 6 (six) hours as needed for wheezing or shortness of breath. 10/07/16   Nita Sickle, MD  amoxicillin (AMOXIL) 875 MG tablet Take 1  tablet (875 mg total) by mouth 2 (two) times daily. 07/31/15   Delorise Royals Cuthriell, PA-C  atorvastatin (LIPITOR) 20 MG tablet Take 20 mg by mouth daily.    Historical Provider, MD  beclomethasone (QVAR) 80 MCG/ACT inhaler Inhale into the lungs 2 (two) times daily.    Historical Provider, MD  glipiZIDE (GLUCOTROL) 10 MG tablet Take 10 mg by mouth daily before breakfast.    Historical Provider, MD  HYDROcodone-acetaminophen (NORCO) 5-325 MG tablet Take 1 tablet by mouth every 6 (six) hours as needed for moderate pain. 03/18/16   Evon Slack, PA-C  HYDROcodone-acetaminophen (NORCO/VICODIN) 5-325 MG tablet Take 1 tablet by mouth every 4 (four) hours as needed for moderate pain. 07/31/15   Delorise Royals Cuthriell, PA-C  magic mouthwash w/lidocaine SOLN Take 5 mLs by mouth 4 (four) times daily. 07/31/15   Delorise Royals Cuthriell, PA-C  metFORMIN (GLUCOPHAGE) 500 MG tablet Take by mouth 2 (two) times daily with a meal.    Historical Provider, MD  montelukast (SINGULAIR) 10 MG tablet Take 10 mg by mouth at bedtime.    Historical Provider, MD  naproxen (NAPROSYN) 500 MG tablet Take 1 tablet (500 mg total) by mouth 2 (two) times daily with a meal. 05/24/16   Joni Reining, PA-C  neomycin-polymyxin-pramoxine (NEOSPORIN PLUS) 1 % cream Apply topically 2 (two) times daily. 05/24/16   Joni Reining, PA-C  oseltamivir (TAMIFLU) 75 MG capsule Take 1 capsule (75 mg total) by mouth 2 (two) times daily. 10/07/16 10/12/16  Nita Sickle, MD  predniSONE (DELTASONE) 20 MG tablet Take 3 tablets (60 mg total) by  mouth daily. 10/07/16 10/11/16  Nita Sicklearolina Thorvald Orsino, MD  sulfamethoxazole-trimethoprim (BACTRIM DS,SEPTRA DS) 800-160 MG tablet Take 1 tablet by mouth 2 (two) times daily. 05/24/16   Joni Reiningonald K Smith, PA-C    Allergies Shellfish allergy  History reviewed. No pertinent family history.  Social History Social History  Substance Use Topics  . Smoking status: Never Smoker  . Smokeless tobacco: Never Used  . Alcohol use No     Review of Systems  Constitutional: + fever, chills, body aches Eyes: Negative for visual changes. ENT: Negative for sore throat. Neck: No neck pain  Cardiovascular: Negative for chest pain. Respiratory: + shortness of breath, wheezing and productive cough Gastrointestinal: Negative for abdominal pain, vomiting or diarrhea. + nausea Genitourinary: Negative for dysuria. Musculoskeletal: Negative for back pain. Skin: Negative for rash. Neurological: Negative for headaches, weakness or numbness. Psych: No SI or HI  ____________________________________________   PHYSICAL EXAM:  VITAL SIGNS: ED Triage Vitals  Enc Vitals Group     BP 10/07/16 1515 (!) 149/94     Pulse Rate 10/07/16 1515 (!) 110     Resp 10/07/16 1515 20     Temp 10/07/16 1515 99.4 F (37.4 C)     Temp Source 10/07/16 1515 Oral     SpO2 10/07/16 1515 94 %     Weight 10/07/16 1514 270 lb (122.5 kg)     Height 10/07/16 1514 6\' 4"  (1.93 m)     Head Circumference --      Peak Flow --      Pain Score 10/07/16 1515 6     Pain Loc --      Pain Edu? --      Excl. in [place]? --     Constitutional: Alert and oriented. Well appearing and in no apparent distress. HEENT:      Head: Normocephalic and atraumatic.         Eyes: Conjunctivae are normal. Sclera is non-icteric. EOMI. PERRL      Mouth/Throat: Mucous membranes are moist.       Neck: Supple with no signs of meningismus. Cardiovascular: Tachycardic with regular rate and rhythm. No murmurs, gallops, or rubs. 2+ symmetrical distal pulses are present in all extremities. No JVD. Respiratory: Normal respiratory effort, slight decreased air movement bilaterally, no wheezes or crackles Gastrointestinal: Soft, non tender, and non distended with positive bowel sounds. No rebound or guarding. Musculoskeletal: Nontender with normal range of motion in all extremities. No edema, cyanosis, or erythema of extremities. Neurologic: Normal speech and language. Face is symmetric.  Moving all extremities. No gross focal neurologic deficits are appreciated. Skin: Skin is warm, dry and intact. No rash noted. Psychiatric: Mood and affect are normal. Speech and behavior are normal.  ____________________________________________   LABS (all labs ordered are listed, but only abnormal results are displayed)  Labs Reviewed  CBC - Abnormal; Notable for the following:       Result Value   WBC 12.5 (*)    HCT 39.8 (*)    All other components within normal limits  URINALYSIS, COMPLETE (UACMP) WITH MICROSCOPIC - Abnormal; Notable for the following:    Color, Urine YELLOW (*)    APPearance CLEAR (*)    Protein, ur 30 (*)    All other components within normal limits  INFLUENZA PANEL BY PCR (TYPE A & B) - Abnormal; Notable for the following:    Influenza B By PCR POSITIVE (*)    All other components within normal limits  BASIC METABOLIC PANEL  TROPONIN  I  CBG MONITORING, ED   ____________________________________________  EKG  ED ECG REPORT I, Nita Sickle, the attending physician, personally viewed and interpreted this ECG.  Sinus tachycardia, rate of 109, normal intervals, normal axis, STE in V2 with no reciprocal changes which is unchanged from prior. ____________________________________________  RADIOLOGY  CXR:  No active cardiopulmonary disease. No evidence of pneumonia or pulmonary edema ____________________________________________   PROCEDURES  Procedure(s) performed: None Procedures Critical Care performed:  None ____________________________________________   INITIAL IMPRESSION / ASSESSMENT AND PLAN / ED COURSE  51 y.o. male the history of asthma, diabetes, hypertension, and hyperlipidemia who presents for evaluation of flulike symptoms x 7 days and asthma exacerbation. We'll give IV fluids, IV Zofran, Tylenol. Will check for the flu, chest x-ray, basic blood work. We'll give DuoNeb treatments and prednisone.  Patient's EKG is unchanged from  prior but patient does have concerning ST segment in V2 on old and current EKG which looks a lot like Brugada to me. I consulted Dr. Welton Flakes, cardiology who recommended DC home and he will see patient in the office tomorrow 9AM. Patient given copies of old and new EKG to take to the clinic. Patient has no CP and troponin here is negative. He has no h/o syncope.   Clinical Course as of Oct 08 1927  Wynelle Link Oct 07, 2016  1610 Patient is positive for influenza B. Started on Tamiflu, received 3 DuoNeb treatments, moving great air with no wheezing. Feels markedly improved after IV fluids. Patient be discharged home on prednisone, refill for his albuterol, Tamiflu, close follow-up with primary care doctor.  [CV]    Clinical Course User Index [CV] Nita Sickle, MD    Pertinent labs & imaging results that were available during my care of the patient were reviewed by me and considered in my medical decision making (see chart for details).    ____________________________________________   FINAL CLINICAL IMPRESSION(S) / ED DIAGNOSES  Final diagnoses:  Influenza B  Exacerbation of asthma, unspecified asthma severity, unspecified whether persistent      NEW MEDICATIONS STARTED DURING THIS VISIT:  New Prescriptions   ALBUTEROL (PROVENTIL HFA;VENTOLIN HFA) 108 (90 BASE) MCG/ACT INHALER    Inhale 2 puffs into the lungs every 6 (six) hours as needed for wheezing or shortness of breath.   ALBUTEROL (PROVENTIL) (2.5 MG/3ML) 0.083% NEBULIZER SOLUTION    Take 3 mLs (2.5 mg total) by nebulization every 6 (six) hours as needed for wheezing or shortness of breath.   OSELTAMIVIR (TAMIFLU) 75 MG CAPSULE    Take 1 capsule (75 mg total) by mouth 2 (two) times daily.   PREDNISONE (DELTASONE) 20 MG TABLET    Take 3 tablets (60 mg total) by mouth daily.     Note:  This document was prepared using Dragon voice recognition software and may include unintentional dictation errors.    Nita Sickle,  MD 10/07/16 2017

## 2016-10-07 NOTE — ED Triage Notes (Addendum)
Pt presents to ED with c/o flu like symptoms at this time. Pt also c/o dizziness x 3 days, and pain in his lungs.  Pt c/o yellow productive cough at this time. Pt is visualized in NAD, ambulatory with no difficulty.

## 2018-07-02 IMAGING — CR DG CHEST 2V
2 series · 2 of 2 positions shown · non-contrast
Comparison: Chest x-rays dated 10/16/2013 and 05/08/2012.

CLINICAL DATA: Flu-like symptoms, dizziness for 3 days, chest pain.

EXAM:
CHEST  2 VIEW

[chest pa]
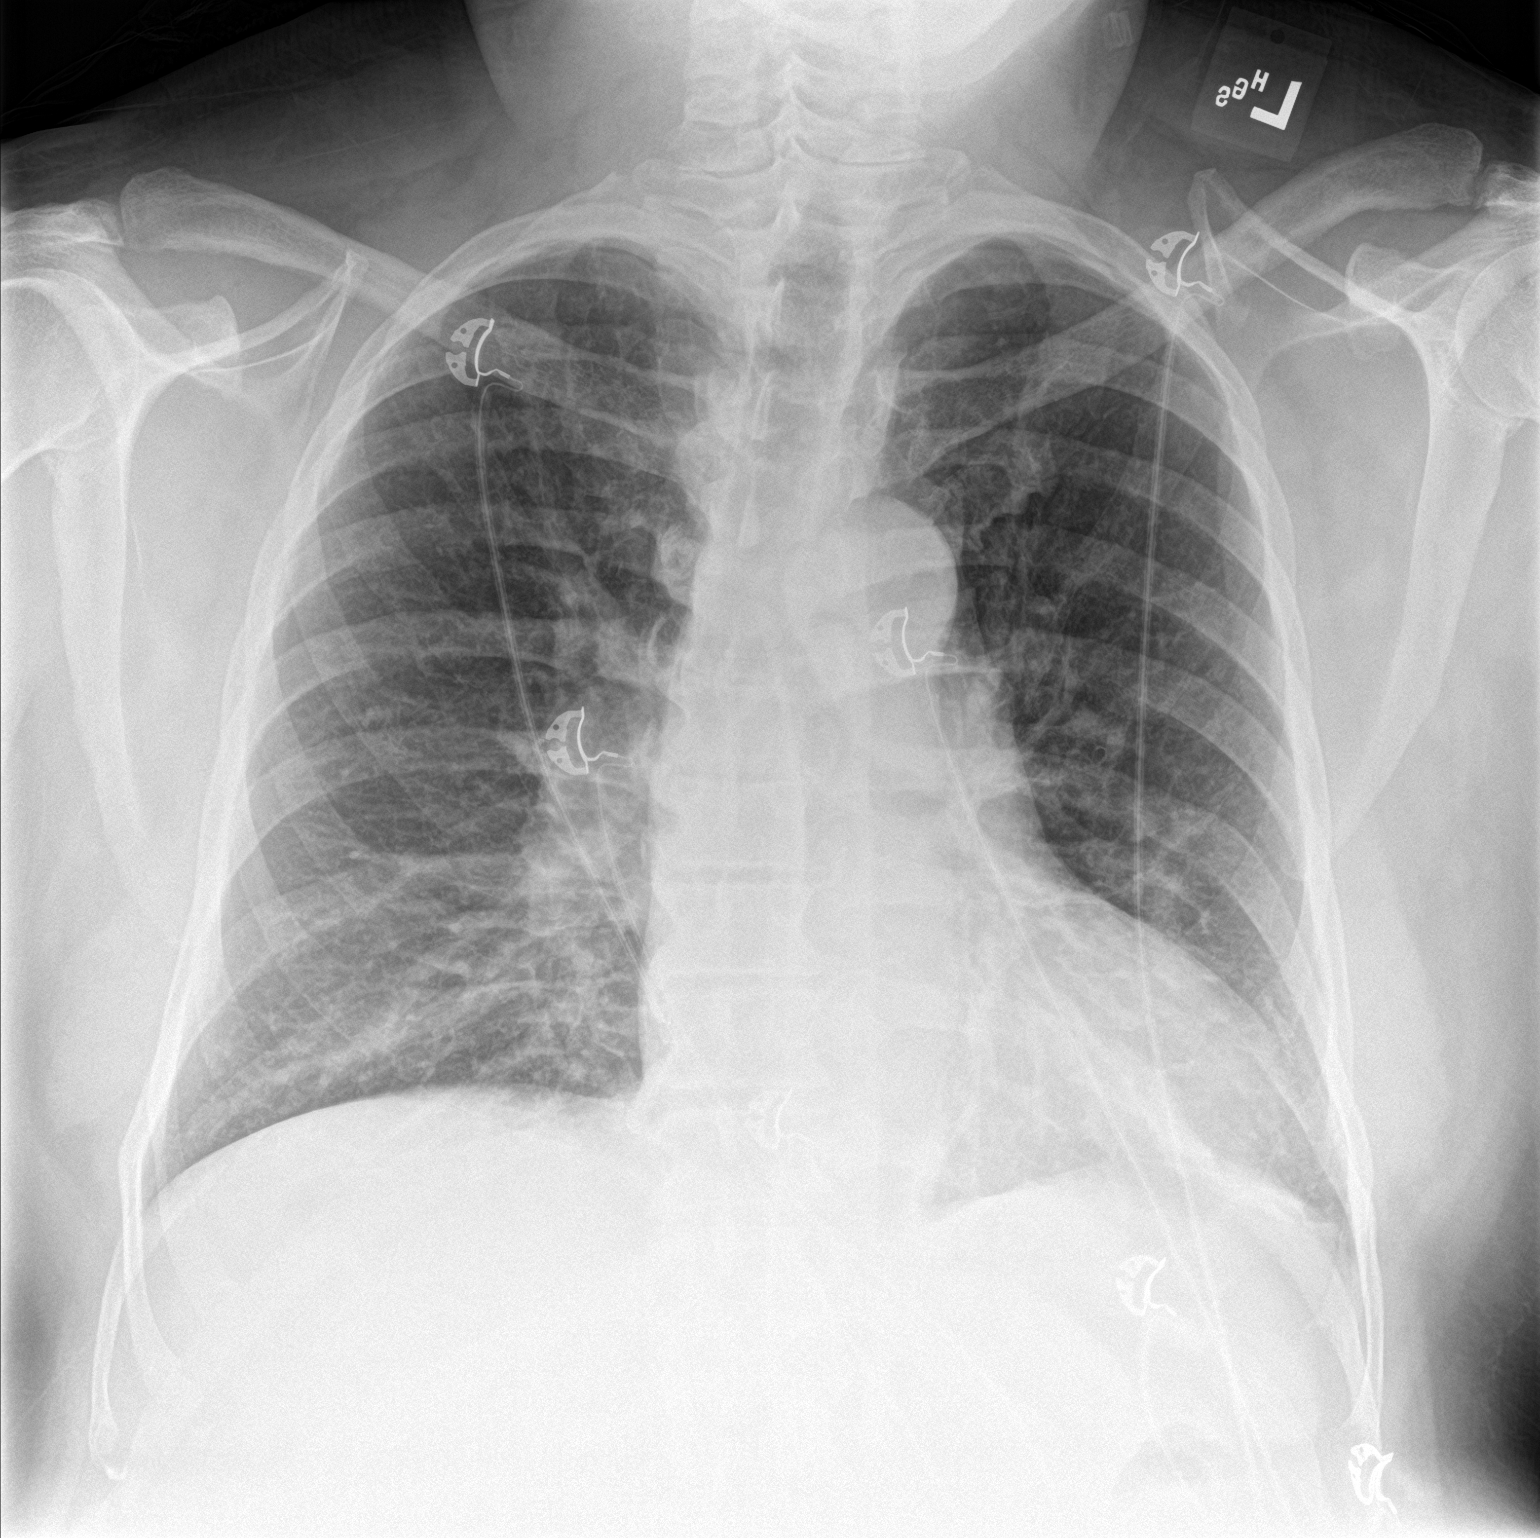

[chest lat]
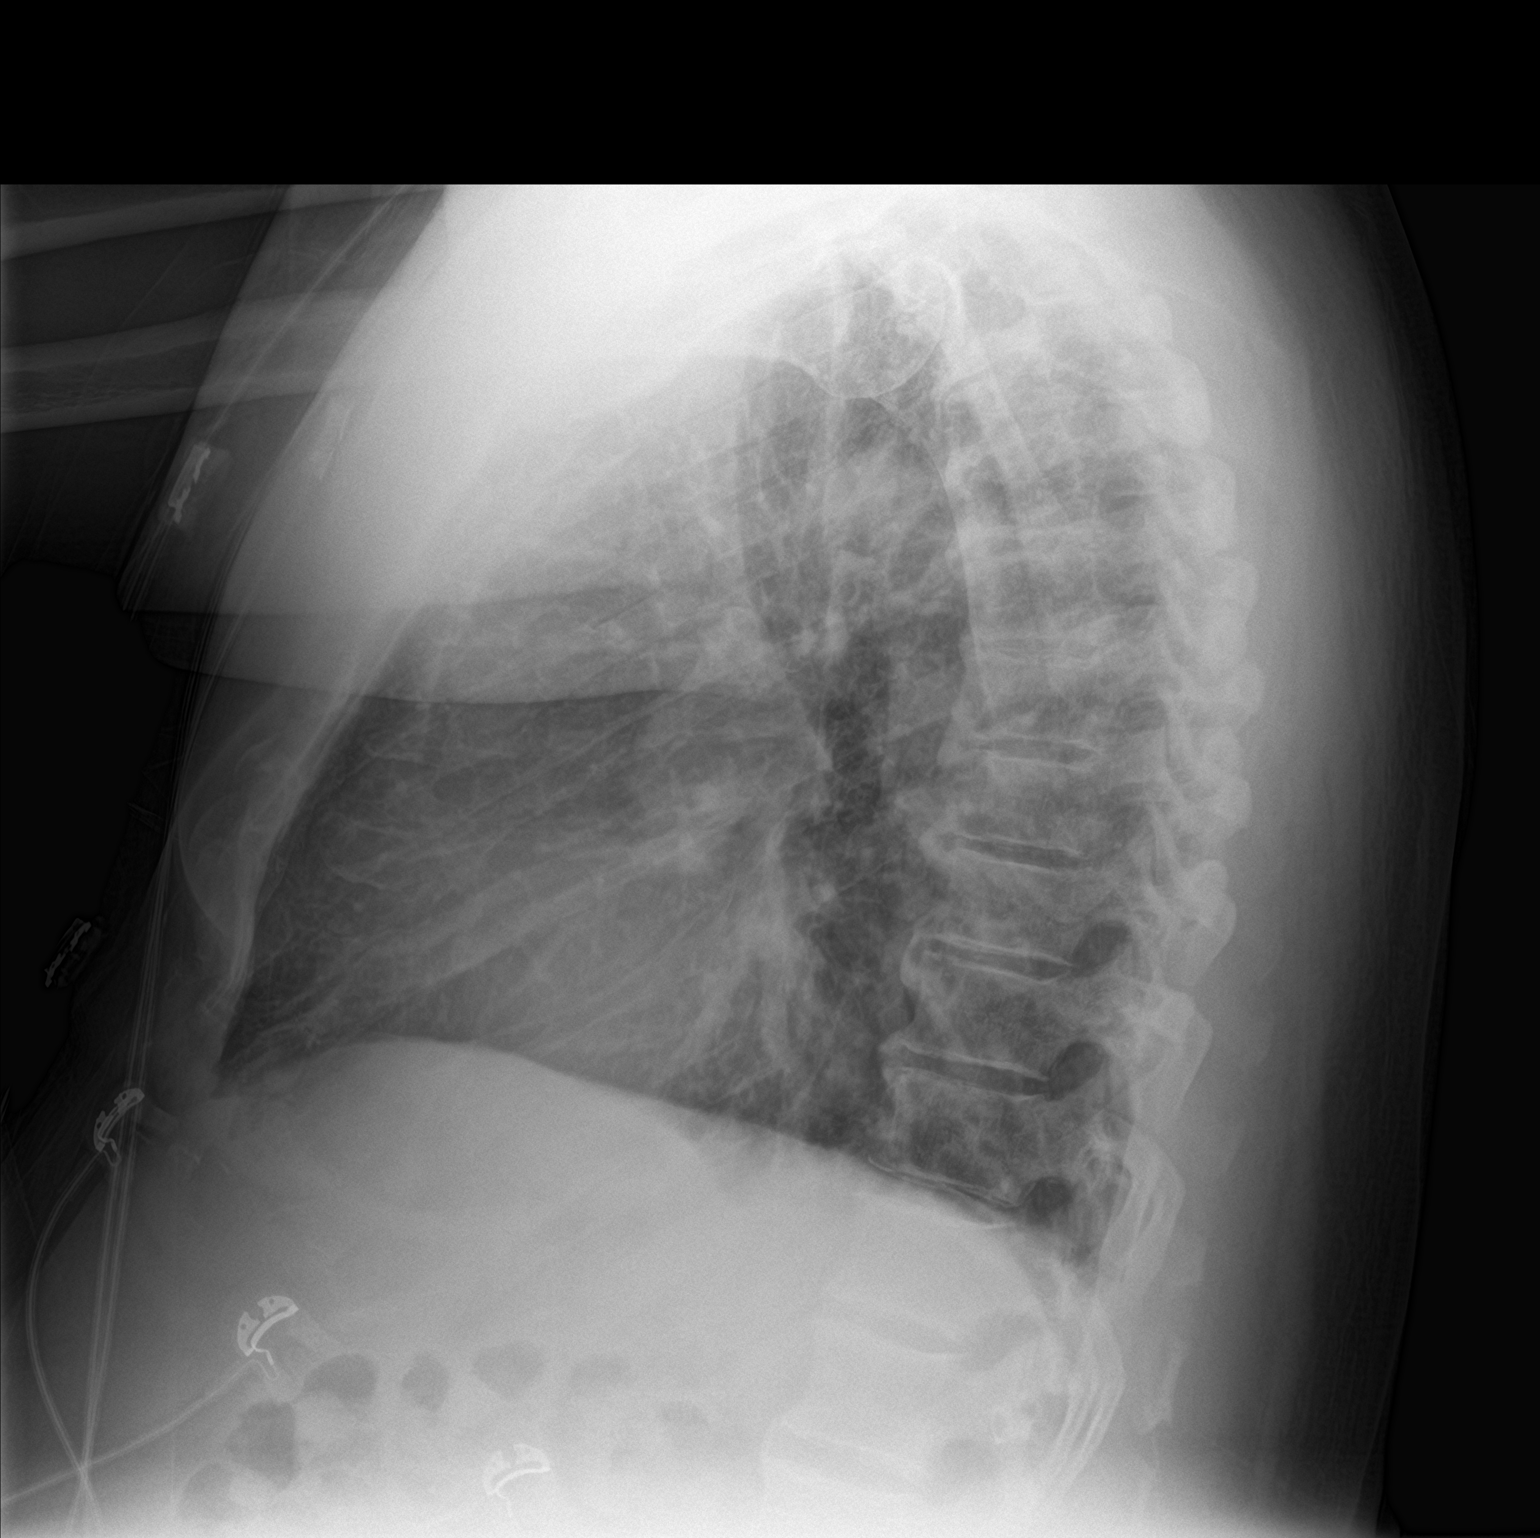

[2 of 2 positions shown; findings below may reference images not displayed]

FINDINGS: Mild cardiomegaly is stable. Overall cardiomediastinal silhouette is
stable in size and configuration. The right perihilar density is not
significantly changed, possibly sequela of chronic pulmonary artery
hypertension.

No new confluent airspace opacity to suggest a developing pneumonia.
No pleural effusion or pneumothorax seen. Mild degenerative spurring
noted within the thoracic spine. No acute or suspicious osseous
finding.
IMPRESSION: No active cardiopulmonary disease. No evidence of pneumonia or
pulmonary edema.

## 2019-12-11 ENCOUNTER — Emergency Department
Admission: EM | Admit: 2019-12-11 | Discharge: 2019-12-11 | Disposition: A | Discharge status: Home / Self Care | Payer: Self-pay | Attending: Emergency Medicine | Admitting: Emergency Medicine

## 2019-12-11 ENCOUNTER — Emergency Department: Payer: Self-pay

## 2019-12-11 ENCOUNTER — Encounter: Payer: Self-pay | Admitting: Emergency Medicine

## 2019-12-11 ENCOUNTER — Other Ambulatory Visit: Payer: Self-pay

## 2019-12-11 DIAGNOSIS — R2232 Localized swelling, mass and lump, left upper limb: Secondary | ICD-10-CM | POA: Insufficient documentation

## 2019-12-11 DIAGNOSIS — I10 Essential (primary) hypertension: Secondary | ICD-10-CM | POA: Insufficient documentation

## 2019-12-11 DIAGNOSIS — J45909 Unspecified asthma, uncomplicated: Secondary | ICD-10-CM | POA: Insufficient documentation

## 2019-12-11 DIAGNOSIS — M79642 Pain in left hand: Secondary | ICD-10-CM | POA: Insufficient documentation

## 2019-12-11 DIAGNOSIS — Z79899 Other long term (current) drug therapy: Secondary | ICD-10-CM | POA: Insufficient documentation

## 2019-12-11 DIAGNOSIS — Z7984 Long term (current) use of oral hypoglycemic drugs: Secondary | ICD-10-CM | POA: Insufficient documentation

## 2019-12-11 DIAGNOSIS — E119 Type 2 diabetes mellitus without complications: Secondary | ICD-10-CM | POA: Insufficient documentation

## 2019-12-11 LAB — CBC WITH DIFFERENTIAL/PLATELET
Abs Immature Granulocytes: 0.03 10*3/uL (ref 0.00–0.07)
Basophils Absolute: 0 10*3/uL (ref 0.0–0.1)
Basophils Relative: 0 %
Eosinophils Absolute: 0.3 10*3/uL (ref 0.0–0.5)
Eosinophils Relative: 3 %
HCT: 42.9 % (ref 39.0–52.0)
Hemoglobin: 14.7 g/dL (ref 13.0–17.0)
Immature Granulocytes: 0 %
Lymphocytes Relative: 18 %
Lymphs Abs: 2 10*3/uL (ref 0.7–4.0)
MCH: 30.6 pg (ref 26.0–34.0)
MCHC: 34.3 g/dL (ref 30.0–36.0)
MCV: 89.2 fL (ref 80.0–100.0)
Monocytes Absolute: 0.6 10*3/uL (ref 0.1–1.0)
Monocytes Relative: 6 %
Neutro Abs: 7.9 10*3/uL — ABNORMAL HIGH (ref 1.7–7.7)
Neutrophils Relative %: 73 %
Platelets: 255 10*3/uL (ref 150–400)
RBC: 4.81 MIL/uL (ref 4.22–5.81)
RDW: 13.8 % (ref 11.5–15.5)
WBC: 10.9 10*3/uL — ABNORMAL HIGH (ref 4.0–10.5)
nRBC: 0 % (ref 0.0–0.2)

## 2019-12-11 LAB — COMPREHENSIVE METABOLIC PANEL
ALT: 12 U/L (ref 0–44)
AST: 15 U/L (ref 15–41)
Albumin: 4 g/dL (ref 3.5–5.0)
Alkaline Phosphatase: 44 U/L (ref 38–126)
Anion gap: 8 (ref 5–15)
BUN: 17 mg/dL (ref 6–20)
CO2: 25 mmol/L (ref 22–32)
Calcium: 9.3 mg/dL (ref 8.9–10.3)
Chloride: 101 mmol/L (ref 98–111)
Creatinine, Ser: 1.02 mg/dL (ref 0.61–1.24)
GFR calc Af Amer: >60 mL/min (ref >60)
GFR calc non Af Amer: >60 mL/min (ref >60)
Glucose, Bld: 124 mg/dL — ABNORMAL HIGH (ref 70–99)
Potassium: 3.3 mmol/L — ABNORMAL LOW (ref 3.5–5.1)
Sodium: 134 mmol/L — ABNORMAL LOW (ref 135–145)
Total Bilirubin: 0.8 mg/dL (ref 0.3–1.2)
Total Protein: 7.9 g/dL (ref 6.5–8.1)

## 2019-12-11 LAB — URIC ACID: Uric Acid, Serum: 5 mg/dL (ref 3.7–8.6)

## 2019-12-11 MED ORDER — CEPHALEXIN 500 MG PO CAPS: 500.0000 mg | ORAL_CAPSULE | Freq: Three times a day (TID) | ORAL | 0 refills | Status: DC

## 2019-12-11 MED ORDER — NAPROXEN 500 MG PO TABS: 500.0000 mg | ORAL_TABLET | Freq: Two times a day (BID) | ORAL | 0 refills | Status: DC

## 2019-12-11 NOTE — Discharge Instructions (Addendum)
Follow-up with your primary care provider if not improving.  Also keep hand elevated to reduce some of the swelling.  Begin taking Keflex 500 mg 3 times daily for the next 10 days and naproxen 500 mg twice daily with food.  If any worsening or fever return to the emergency department over the weekend to be reevaluated.

## 2019-12-11 NOTE — ED Triage Notes (Signed)
Pt to ED via POV, pts left hand is swollen. Pt states that last night it started with swelling in the left middle finger and spread throughout the night. Pt c/o minimal pain to the hand. Pt denies injury. Pt denies any bug bites or any other changed. No swelling anywhere else. Pt has positive pulse.

## 2019-12-11 NOTE — ED Provider Notes (Signed)
[facility] Emergency Department Provider Note  ____________________________________________   None    (approximate)  I have reviewed the triage vital signs and the nursing notes.   HISTORY  Chief Complaint Hand Swelling   HPI Ronald Bridges is a 54 y.o. male presents to the ED with complaint of left hand swelling.  Patient states that it began swelling in his left third finger last evening and is spread throughout his entire hand during the night.  Patient states he has minimal pain but that the edema is making it difficult for him to move.  He is unaware of any insect bites or injury to the skin.  He denies any previous gout.  Patient is diabetic and continues to take glipizide and Metformin for his diabetes.  Currently rates pain as 3 out of 10.         Past Medical History:  Diagnosis Date  . Asthma   . Diabetes mellitus without complication (HCC)   . High cholesterol   . Hypertension     There are no problems to display for this patient.   History reviewed. No pertinent surgical history.  Prior to Admission medications   Medication Sig Start Date End Date Taking? Authorizing Provider  albuterol (PROVENTIL HFA;VENTOLIN HFA) 108 (90 Base) MCG/ACT inhaler Inhale 2 puffs into the lungs every 6 (six) hours as needed for wheezing or shortness of breath.    [provider]  albuterol (PROVENTIL HFA;VENTOLIN HFA) 108 (90 Base) MCG/ACT inhaler Inhale 2 puffs into the lungs every 6 (six) hours as needed for wheezing or shortness of breath. 10/07/16   Nita Sickle, MD  albuterol (PROVENTIL) (2.5 MG/3ML) 0.083% nebulizer solution Take 3 mLs (2.5 mg total) by nebulization every 6 (six) hours as needed for wheezing or shortness of breath. 10/07/16   Nita Sickle, MD  atorvastatin (LIPITOR) 20 MG tablet Take 20 mg by mouth daily.    [provider]  beclomethasone (QVAR) 80 MCG/ACT inhaler Inhale into the lungs 2 (two) times daily.     [provider]  cephALEXin (KEFLEX) 500 MG capsule Take 1 capsule (500 mg total) by mouth 3 (three) times daily. 12/11/19   Tommi Rumps, PA-C  glipiZIDE (GLUCOTROL) 10 MG tablet Take 10 mg by mouth daily before breakfast.    [provider]  metFORMIN (GLUCOPHAGE) 500 MG tablet Take by mouth 2 (two) times daily with a meal.    [provider]  montelukast (SINGULAIR) 10 MG tablet Take 10 mg by mouth at bedtime.    [provider]  naproxen (NAPROSYN) 500 MG tablet Take 1 tablet (500 mg total) by mouth 2 (two) times daily with a meal. 12/11/19   Tommi Rumps, PA-C  neomycin-polymyxin-pramoxine (NEOSPORIN PLUS) 1 % cream Apply topically 2 (two) times daily. 05/24/16   Joni Reining, PA-C    Allergies Shellfish allergy  No family history on file.  Social History Social History   Tobacco Use  . Smoking status: Never Smoker  . Smokeless tobacco: Never Used  Substance Use Topics  . Alcohol use: No  . Drug use: No    Review of Systems Constitutional: No fever/chills ENT: No sore throat. Cardiovascular: Denies chest pain. Respiratory: Denies shortness of breath. Gastrointestinal: No abdominal pain.  No nausea, no vomiting.  Musculoskeletal: Positive for left hand pain. Skin: Negative for rash. Neurological: Negative for  focal weakness or numbness.  ____________________________________________   PHYSICAL EXAM:  VITAL SIGNS: ED Triage Vitals  Enc  Vitals Group     BP 12/11/19 0801 (!) 178/96     Pulse Rate 12/11/19 0801 76     Resp 12/11/19 0801 16     Temp 12/11/19 0801 (!) 97.5 F (36.4 C)     Temp Source 12/11/19 0801 Oral     SpO2 12/11/19 0801 99 %     Weight 12/11/19 0802 260 lb (117.9 kg)     Height 12/11/19 0802 6\' 4"  (1.93 m)     Head Circumference --      Peak Flow --      Pain Score 12/11/19 0801 3     Pain Loc --      Pain Edu? --      Excl. in [place]? --    Constitutional: Alert and oriented. Well appearing and in  no acute distress. Eyes: Conjunctivae are normal.  Head: Atraumatic. Neck: No stridor.   Cardiovascular: Normal rate, regular rhythm. Grossly normal heart sounds.  Good peripheral circulation. Respiratory: Normal respiratory effort.  No retractions. Lungs CTAB. Musculoskeletal: Left hand is moderately edematous in comparison with the right.  The dorsal of the hand has soft tissue edema and slight warmth to touch.  No evidence of an open wound is found on the volar, dorsal or webspaces of the digits.  Motor sensory function intact.  Capillary refills less than 3 seconds.  Pulses present.  Patient is able to flex and extend all digits without any difficulty. Neurologic:  Normal speech and language. No gross focal neurologic deficits are appreciated.  Skin:  Skin is warm, dry and intact.  No evidence of puncture wound, abrasion or erythema present.  Skin is warm to touch as noted above. Psychiatric: Mood and affect are normal. Speech and behavior are normal.  ____________________________________________   LABS (all labs ordered are listed, but only abnormal results are displayed)  Labs Reviewed  CBC WITH DIFFERENTIAL/PLATELET - Abnormal; Notable for the following components:      Result Value   WBC 10.9 (*)    Neutro Abs 7.9 (*)    All other components within normal limits  COMPREHENSIVE METABOLIC PANEL - Abnormal; Notable for the following components:   Sodium 134 (*)    Potassium 3.3 (*)    Glucose, Bld 124 (*)    All other components within normal limits  URIC ACID    RADIOLOGY  Official radiology report(s): DG Hand Complete Left  Result Date: 12/11/2019 CLINICAL DATA:  Hand swelling last month. EXAM: LEFT HAND - COMPLETE 3+ VIEW COMPARISON:  None FINDINGS: Marked diffuse soft tissue swelling particularly along the dorsum of the hand. Chronic appearing deformity involving the mid shaft of the right fifth metacarpal bone is identified. No acute fracture or dislocation identified. No  radio-opaque foreign bodies. IMPRESSION: 1. Marked diffuse soft tissue swelling. 2. No acute bone abnormality. Electronically Signed   By: Kerby Moors M.D.   On: 12/11/2019 09:38    ____________________________________________   PROCEDURES  Procedure(s) performed (including Critical Care):  Procedures  ____________________________________________   INITIAL IMPRESSION / ASSESSMENT AND PLAN / ED COURSE  As part of my medical decision making, I reviewed the following data within the electronic MEDICAL RECORD NUMBER Notes from prior ED visits and [place] Controlled Substance Database  54 year old male presents to the ED with complaint of left hand swelling.  He states initially his third finger began swelling and then during the night he continued to the entire hand.  He denies any recent injury.  He denies any history  of gout.  Physical exam was negative for any acute injury or evidence of a puncture wound.  Skin is warm to touch but no erythema was noted.  Patient is able to flex and extend all digits without any difficulty and capillary refills less than 3 seconds.  X-ray was negative for any acute bony injury.  Patient was made aware that his labs were basically normal with the exception of his potassium being a slight decrease and to increase foods rich in potassium was discussed.  Patient was given a prescription for Keflex 500 mg and naproxen 500 mg twice daily with food.  He is to follow-up with his PCP if any continued problems.  He is to return to the emergency department if any worsening of his symptoms over the weekend including a fever.  He has been covered with Keflex for possible infection and naproxen for inflammation.  ____________________________________________   FINAL CLINICAL IMPRESSION(S) / ED DIAGNOSES  Final diagnoses:  Left hand pain     ED Discharge Orders         Ordered    cephALEXin (KEFLEX) 500 MG capsule  3 times daily     12/11/19 1036    naproxen (NAPROSYN)  500 MG tablet  2 times daily with meals     12/11/19 1036           Note:  This document was prepared using Dragon voice recognition software and may include unintentional dictation errors.    Tommi Rumps, PA-C 12/11/19 1116    Sharyn Creamer, MD 12/11/19 1530

## 2021-09-04 IMAGING — DX DG HAND COMPLETE 3+V*L*
3 series · 3 of 3 positions shown · non-contrast
Comparison: None

CLINICAL DATA: Hand swelling last month.

EXAM:
LEFT HAND - COMPLETE 3+ VIEW

[hand ap]
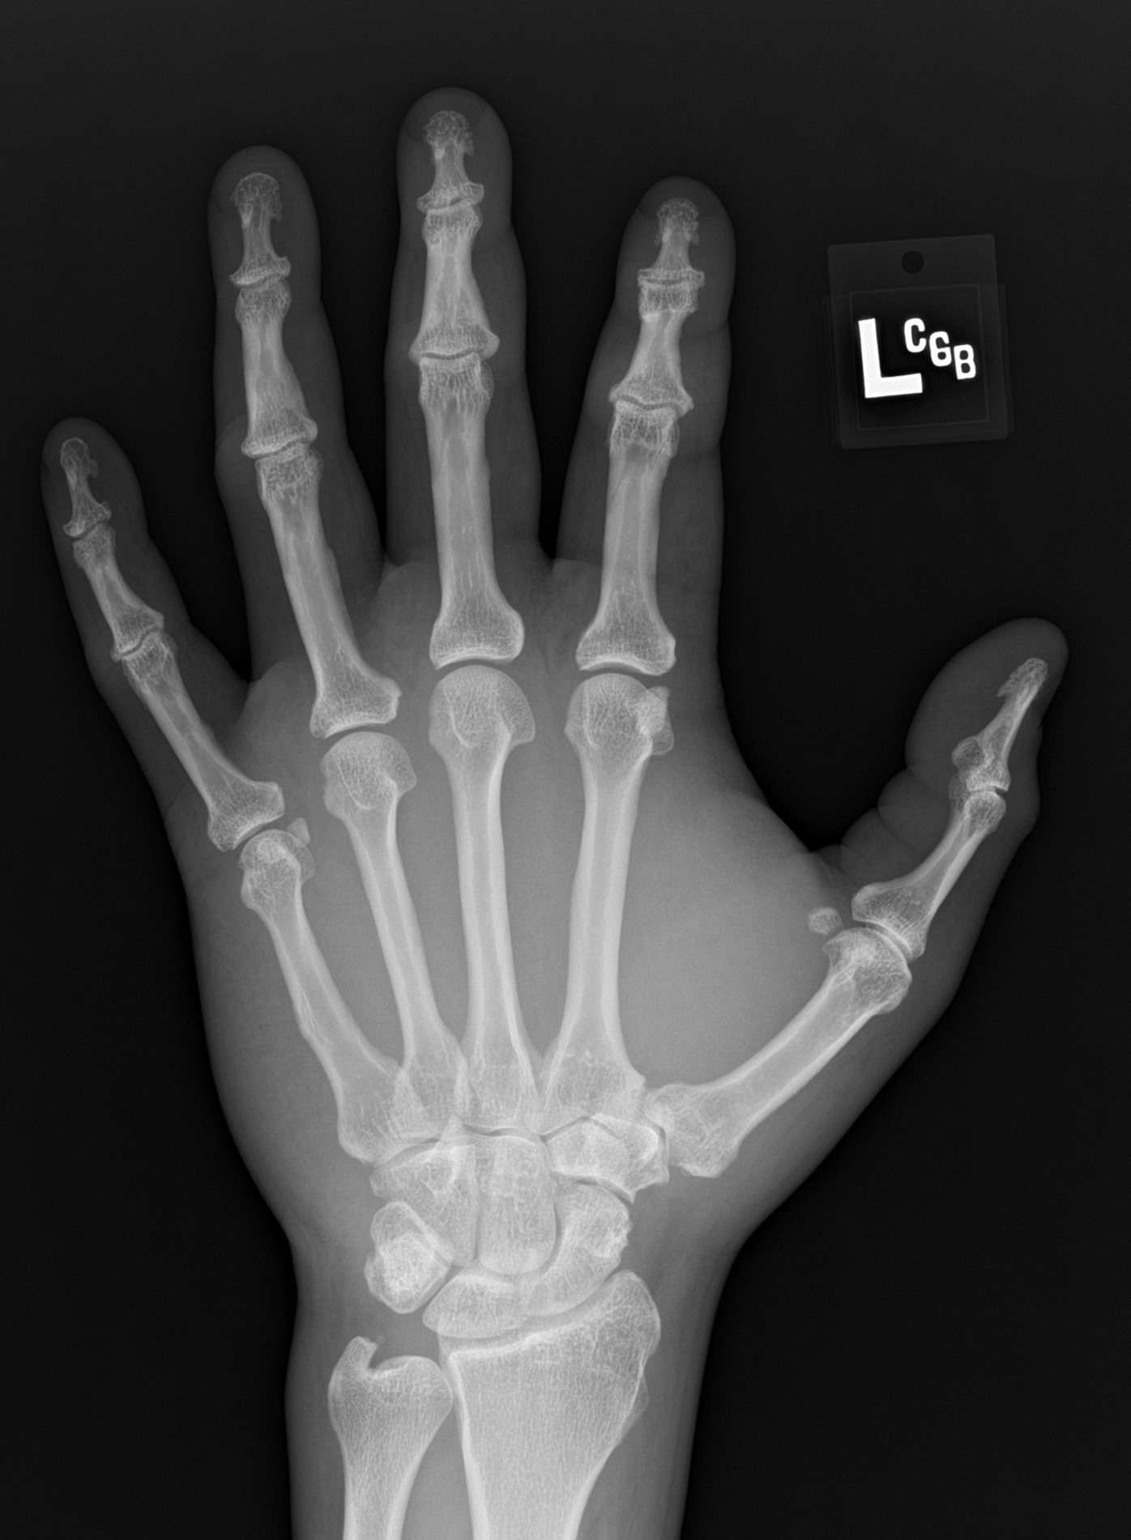

[hand obl]
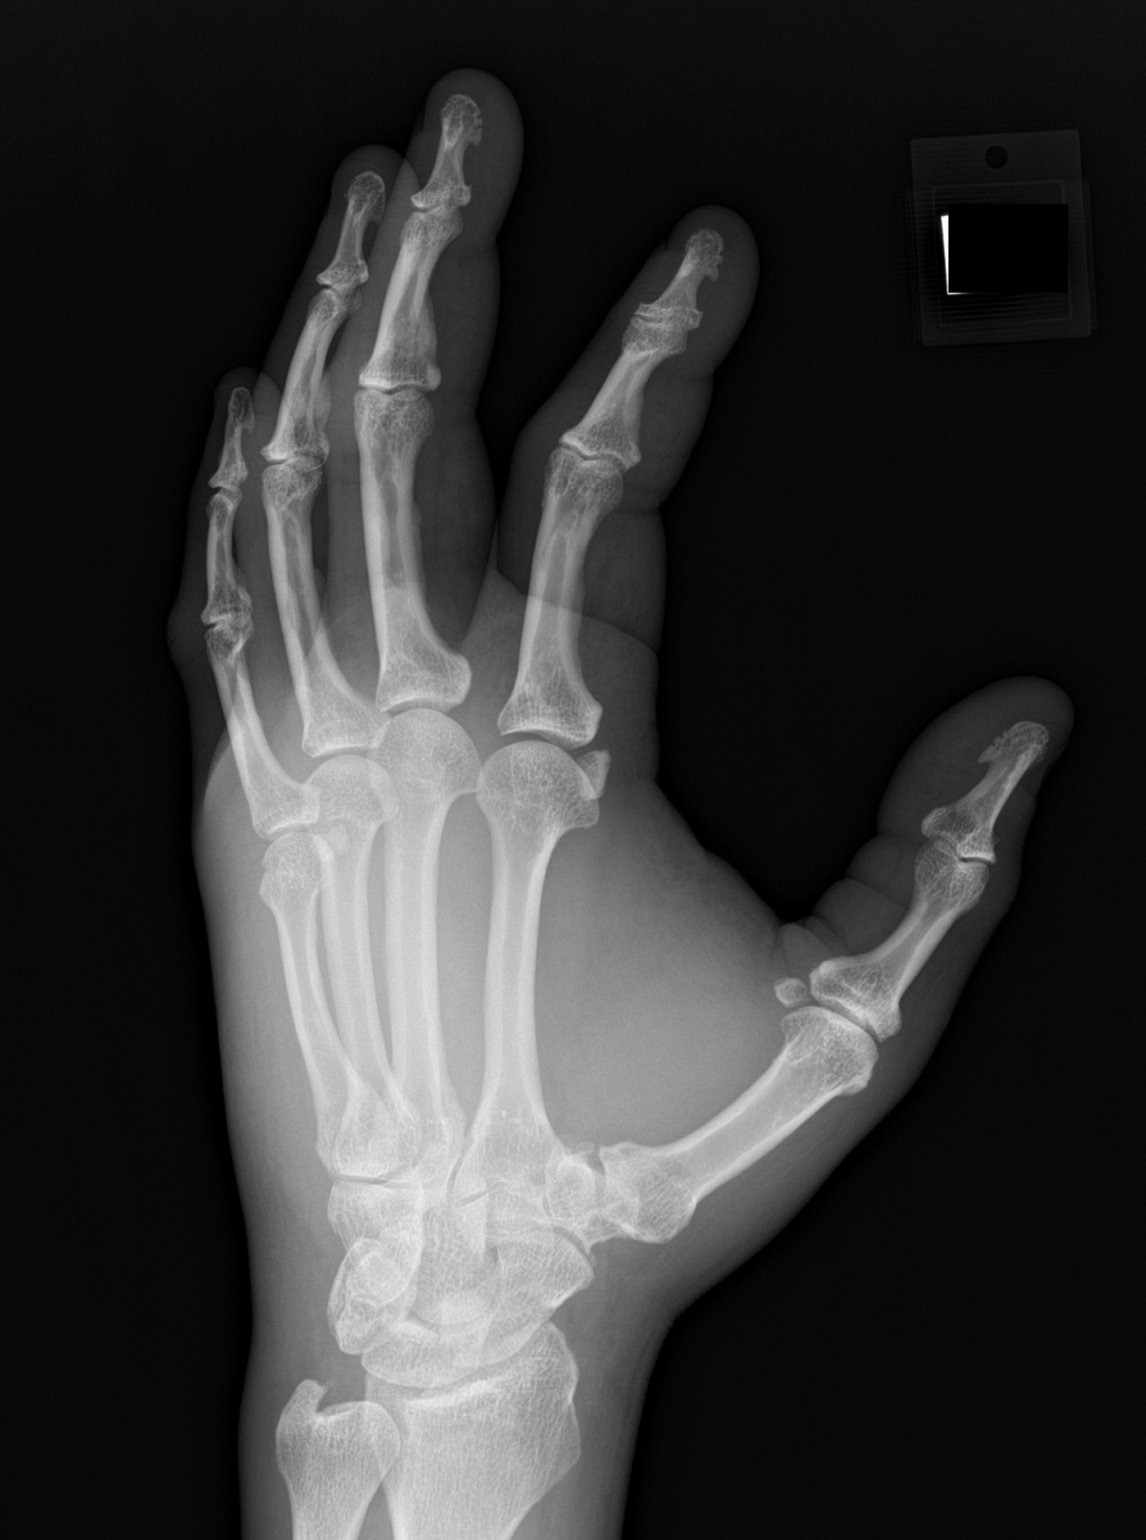

[hand lat]
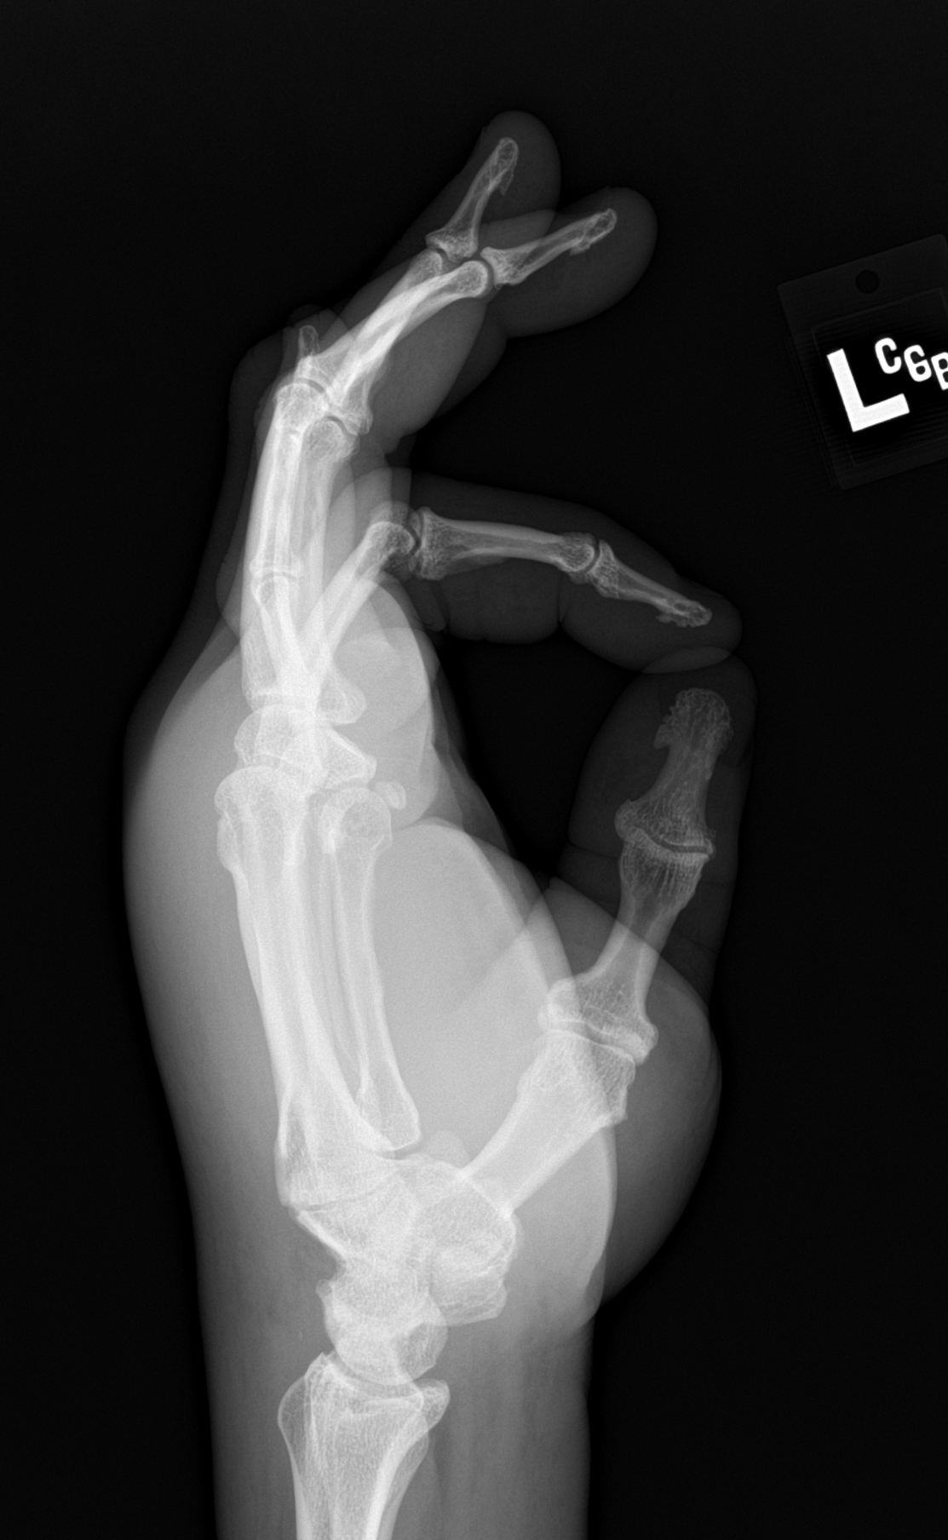

[3 of 3 positions shown; findings below may reference images not displayed]

FINDINGS: Marked diffuse soft tissue swelling particularly along the dorsum of
the hand. Chronic appearing deformity involving the mid shaft of the
right fifth metacarpal bone is identified. No acute fracture or
dislocation identified. No radio-opaque foreign bodies.
IMPRESSION: 1. Marked diffuse soft tissue swelling.
2. No acute bone abnormality.

## 2022-02-10 ENCOUNTER — Other Ambulatory Visit: Payer: Self-pay

## 2022-02-10 ENCOUNTER — Ambulatory Visit
Admission: EM | Admit: 2022-02-10 | Discharge: 2022-02-10 | Disposition: A | Discharge status: Home / Self Care | Payer: BLUE CROSS/BLUE SHIELD

## 2022-02-10 ENCOUNTER — Encounter: Payer: Self-pay | Admitting: Emergency Medicine

## 2022-02-10 DIAGNOSIS — J0120 Acute ethmoidal sinusitis, unspecified: Secondary | ICD-10-CM

## 2022-02-10 DIAGNOSIS — J301 Allergic rhinitis due to pollen: Secondary | ICD-10-CM

## 2022-02-10 DIAGNOSIS — J4521 Mild intermittent asthma with (acute) exacerbation: Secondary | ICD-10-CM | POA: Diagnosis not present

## 2022-02-10 MED ORDER — FLUTICASONE PROPIONATE 50 MCG/ACT NA SUSP: 2.0000 | Freq: Every day | NASAL | 2 refills | Status: DC

## 2022-02-10 MED ORDER — MONTELUKAST SODIUM 10 MG PO TABS: 10.0000 mg | ORAL_TABLET | Freq: Every day | ORAL | 0 refills | Status: AC

## 2022-02-10 MED ORDER — CETIRIZINE HCL 10 MG PO TABS: 10.0000 mg | ORAL_TABLET | Freq: Every day | ORAL | 0 refills | Status: DC

## 2022-02-10 MED ORDER — ALBUTEROL SULFATE HFA 108 (90 BASE) MCG/ACT IN AERS: 1.0000 | INHALATION_SPRAY | Freq: Four times a day (QID) | RESPIRATORY_TRACT | 0 refills | Status: AC | PRN

## 2022-02-10 MED ORDER — PREDNISONE 50 MG PO TABS: ORAL_TABLET | ORAL | 0 refills | Status: DC

## 2022-02-10 NOTE — ED Provider Notes (Signed)
[facility]   MRN: [id number] DOB: [birth year]  Subjective:   Chief Complaint;  Chief Complaint  Patient presents with   Nasal Congestion   Pt comes in c/o nasal congestion, nasal tightness and runny nose for the last 2-3 days. Denies cough, sore throat, ear pain.  Ronald Bridges is a 56 y.o. male presenting for nasal congestion history of allergies.  No sore throat cough or ear pain.  Patient currently not taking any medications other than Benadryl for his allergies he is a diabetic well controlled last blood sugar this morning 109  No current facility-administered medications for this encounter.  Current Outpatient Medications:    albuterol (VENTOLIN HFA) 108 (90 Base) MCG/ACT inhaler, Inhale 1-2 puffs into the lungs every 6 (six) hours as needed for wheezing or shortness of breath., Disp: 18 g, Rfl: 0   atorvastatin (LIPITOR) 20 MG tablet, Take 20 mg by mouth daily., Disp: , Rfl:    beclomethasone (QVAR) 80 MCG/ACT inhaler, Inhale into the lungs 2 (two) times daily., Disp: , Rfl:    cetirizine (ZYRTEC ALLERGY) 10 MG tablet, Take 1 tablet (10 mg total) by mouth daily., Disp: 30 tablet, Rfl: 0   empagliflozin (JARDIANCE) 10 MG TABS tablet, Take 10 mg by mouth daily., Disp: , Rfl:    fluticasone (FLONASE) 50 MCG/ACT nasal spray, Place 2 sprays into both nostrils daily., Disp: 11.1 mL, Rfl: 2   metFORMIN (GLUCOPHAGE) 500 MG tablet, Take by mouth 2 (two) times daily with a meal., Disp: , Rfl:    montelukast (SINGULAIR) 10 MG tablet, Take 1 tablet (10 mg total) by mouth at bedtime., Disp: 30 tablet, Rfl: 0   predniSONE (DELTASONE) 50 MG tablet, Take 1 pill daily for 5 days as directed, Disp: 5 tablet, Rfl: 0   cephALEXin (KEFLEX) 500 MG capsule, Take 1 capsule (500 mg total) by mouth 3 (three) times daily., Disp: 30 capsule, Rfl: 0   glipiZIDE (GLUCOTROL) 10 MG tablet, Take 10 mg by mouth daily before breakfast., Disp: , Rfl:    naproxen (NAPROSYN) 500 MG tablet, Take 1  tablet (500 mg total) by mouth 2 (two) times daily with a meal., Disp: 20 tablet, Rfl: 0   neomycin-polymyxin-pramoxine (NEOSPORIN PLUS) 1 % cream, Apply topically 2 (two) times daily., Disp: 14.2 g, Rfl: 0   Allergies  Allergen Reactions   Shellfish Allergy Anaphylaxis    Past Medical History:  Diagnosis Date   Asthma    Diabetes mellitus without complication (HCC)    High cholesterol    Hypertension      Review of Systems  All other systems reviewed and are negative.    Objective:   Vitals: BP (!) 149/85 (BP Location: Left Arm)   Pulse 74   Temp 98.3 F (36.8 C) (Oral)   Resp 18   Ht 6\' 4"  (1.93 m)   Wt 260 lb (117.9 kg)   SpO2 94%   BMI 31.65 kg/m   Physical Exam Vitals and nursing note reviewed.  Constitutional:      General: He is not in acute distress.    Appearance: He is well-developed.  HENT:     Head: Normocephalic and atraumatic.     Nose: Congestion present.     Comments: Nasal congestion diagnosed with several sinus tender in his frontal    Mouth/Throat:     Pharynx: Oropharyngeal exudate present. No posterior oropharyngeal erythema.  Eyes:     General: No scleral icterus.  Right eye: No discharge.        Left eye: No discharge.     Conjunctiva/sclera: Conjunctivae normal.  Cardiovascular:     Rate and Rhythm: Normal rate and regular rhythm.     Heart sounds: No murmur heard. Pulmonary:     Effort: Pulmonary effort is normal. No respiratory distress.     Breath sounds: Normal breath sounds.  Abdominal:     Palpations: Abdomen is soft.     Tenderness: There is no abdominal tenderness.  Musculoskeletal:        General: No swelling.     Cervical back: Neck supple.  Skin:    General: Skin is warm and dry.     Capillary Refill: Capillary refill takes less than 2 seconds.  Neurological:     Mental Status: He is alert.  Psychiatric:        Mood and Affect: Mood normal.     No results found for this or any previous visit (from the  past 24 hour(s)).  No results found.     Assessment and Plan :   1. Seasonal allergic rhinitis due to pollen   2. Mild intermittent asthma with acute exacerbation   3. Acute ethmoidal sinusitis, recurrence not specified     Meds ordered this encounter  Medications   montelukast (SINGULAIR) 10 MG tablet    Sig: Take 1 tablet (10 mg total) by mouth at bedtime.    Dispense:  30 tablet    Refill:  0   albuterol (VENTOLIN HFA) 108 (90 Base) MCG/ACT inhaler    Sig: Inhale 1-2 puffs into the lungs every 6 (six) hours as needed for wheezing or shortness of breath.    Dispense:  18 g    Refill:  0   cetirizine (ZYRTEC ALLERGY) 10 MG tablet    Sig: Take 1 tablet (10 mg total) by mouth daily.    Dispense:  30 tablet    Refill:  0   fluticasone (FLONASE) 50 MCG/ACT nasal spray    Sig: Place 2 sprays into both nostrils daily.    Dispense:  11.1 mL    Refill:  2   predniSONE (DELTASONE) 50 MG tablet    Sig: Take 1 pill daily for 5 days as directed    Dispense:  5 tablet    Refill:  0    MDM:  Ronald Bridges is a 56 y.o. male presenting for nasal congestion and severe allergy symptoms without fever or respiratory distress.  He does have history of asthma and states he has some wheezing at night there was no obvious respiratory distress on his exam today no wheezing.  Prescriptions are written for better allergy control and short course of steroid despite him being a diabetic risk versus benefit ratio considered.  I discussed treatment, follow up and return instructions. Questions were answered. Patient/representative stated understanding of instructions and patient is stable for discharge.  Ronald Conger FNP-C MCN    Jone Baseman, NP 02/10/22 1615

## 2022-02-10 NOTE — Discharge Instructions (Addendum)
Use medications and utilize steam treatments to help decongest.  Return if new or worsening symptoms at any time for reevaluation

## 2022-02-10 NOTE — ED Triage Notes (Signed)
Pt comes in c/o nasal congestion, nasal tightness and runny nose for the last 2-3 days. Denies cough, sore throat, ear pain.

## 2022-09-20 DIAGNOSIS — Z20822 Contact with and (suspected) exposure to covid-19: Secondary | ICD-10-CM | POA: Diagnosis not present

## 2022-09-20 DIAGNOSIS — J069 Acute upper respiratory infection, unspecified: Secondary | ICD-10-CM | POA: Diagnosis not present

## 2022-09-20 DIAGNOSIS — Z712 Person consulting for explanation of examination or test findings: Secondary | ICD-10-CM | POA: Diagnosis not present

## 2022-09-25 ENCOUNTER — Emergency Department
Admission: EM | Admit: 2022-09-25 | Discharge: 2022-09-25 | Disposition: A | Discharge status: Home / Self Care | Payer: 59 | Attending: Emergency Medicine | Admitting: Emergency Medicine

## 2022-09-25 ENCOUNTER — Other Ambulatory Visit: Payer: Self-pay

## 2022-09-25 ENCOUNTER — Encounter: Payer: Self-pay | Admitting: Emergency Medicine

## 2022-09-25 DIAGNOSIS — Z1152 Encounter for screening for COVID-19: Secondary | ICD-10-CM | POA: Insufficient documentation

## 2022-09-25 DIAGNOSIS — J302 Other seasonal allergic rhinitis: Secondary | ICD-10-CM | POA: Diagnosis not present

## 2022-09-25 DIAGNOSIS — J45909 Unspecified asthma, uncomplicated: Secondary | ICD-10-CM | POA: Diagnosis not present

## 2022-09-25 DIAGNOSIS — E1165 Type 2 diabetes mellitus with hyperglycemia: Secondary | ICD-10-CM | POA: Insufficient documentation

## 2022-09-25 DIAGNOSIS — I10 Essential (primary) hypertension: Secondary | ICD-10-CM | POA: Diagnosis not present

## 2022-09-25 DIAGNOSIS — R0981 Nasal congestion: Secondary | ICD-10-CM | POA: Diagnosis not present

## 2022-09-25 LAB — CBG MONITORING, ED: Glucose-Capillary: 175 mg/dL — ABNORMAL HIGH (ref 70–99)

## 2022-09-25 LAB — RESP PANEL BY RT-PCR (RSV, FLU A&B, COVID)  RVPGX2
Influenza A by PCR: NEGATIVE
Influenza B by PCR: NEGATIVE
Resp Syncytial Virus by PCR: NEGATIVE
SARS Coronavirus 2 by RT PCR: NEGATIVE

## 2022-09-25 MED ORDER — DEXAMETHASONE SODIUM PHOSPHATE 10 MG/ML IJ SOLN
10.0000 mg | Freq: Once | INTRAMUSCULAR | Status: AC
  Administered 2022-09-25: 10 mg via INTRAMUSCULAR
  Filled 2022-09-25: qty 1

## 2022-09-25 NOTE — ED Triage Notes (Signed)
Pt presents via POV with complaints of nasal congestion that started 1-2 weeks ago. Has tried OTC medication without relief. Saw his PCP who prescribed medication that was equally ineffective. Denies CP or SOB.

## 2022-09-25 NOTE — ED Provider Notes (Addendum)
[facility] Provider Note    Event Date/Time   First MD Initiated Contact with Patient 09/25/22 [phone]     (approximate)   History   Nasal Congestion   HPI  Ronald Bridges is a 57 y.o. male   presents to the ED with complaint of nasal congestion and sneezing for approximately 2 weeks.  Patient states that he saw his PCP and was placed on a nasal spray and medicine for congestion which is not working.  He does not know the medication names and did not bring this with him.  He is also on over-the-counter medication for congestion.  Patient denies any fever at home.  He is unaware of any known sick exposures.  Patient has a history of non-insulin-dependent diabetes, hypertension, asthma and seasonal allergies.       Physical Exam   Triage Vital Signs: ED Triage Vitals [09/25/22 0635]  Enc Vitals Group     BP (!) 159/92     Pulse Rate 78     Resp 18     Temp 98 F (36.7 C)     Temp Source Oral     SpO2 98 %     Weight 255 lb (115.7 kg)     Height 6\' 4"  (1.93 m)     Head Circumference      Peak Flow      Pain Score 0     Pain Loc      Pain Edu?      Excl. in [place]?     Most recent vital signs: Vitals:   09/25/22 0635  BP: (!) 159/92  Pulse: 78  Resp: 18  Temp: 98 F (36.7 C)  SpO2: 98%     General: Awake, no distress.  CV:  Good peripheral perfusion.  Heart regular rate and rhythm. Resp:  Normal effort.  Clear bilaterally. Abd:  No distention.  Other:  Nasal congestion without rhinorrhea.  Posterior pharynx without erythema or exudate.  Uvula is midline.  Neck is supple without cervical lymphadenopathy.   ED Results / Procedures / Treatments   Labs (all labs ordered are listed, but only abnormal results are displayed) Labs Reviewed  CBG MONITORING, ED - Abnormal; Notable for the following components:      Result Value   Glucose-Capillary 175 (*)    All other components within normal limits  RESP PANEL BY RT-PCR (RSV, FLU A&B,  COVID)  RVPGX2      PROCEDURES:  Critical Care performed:   Procedures   MEDICATIONS ORDERED IN ED: Medications  dexamethasone (DECADRON) injection 10 mg (10 mg Intramuscular Given 09/25/22 0834)     IMPRESSION / MDM / [place] / ED COURSE  I reviewed the triage vital signs and the nursing notes.   Differential diagnosis includes, but is not limited to, viral URI, seasonal allergies, allergic rhinitis.  57 year old male presents to the ED with complaint of nasal congestion for the last 2 weeks.  He recently saw his PCP and was placed on unknown medication and did not bring this with him.  He denies any fever at home.  Physical exam was benign with exception of some nasal congestion.  Patient was given an injection of Decadron 10 mg IM and he is to continue using the medication that was prescribed for him by his PCP.  He is strongly encouraged to call his PCP to let them know that he does not believe that the medication that was prescribed for him is  working.  I am unable to see records from [facility] or [employer] [facility] and not sure what medication was prescribed for him.  If he is not currently on any allergy medication the Decadron injection should help.  Patient was also made aware that his respiratory panel was negative for COVID, influenza and RSV.      Patient's presentation is most consistent with acute complicated illness / injury requiring diagnostic workup.  FINAL CLINICAL IMPRESSION(S) / ED DIAGNOSES   Final diagnoses:  Seasonal allergic rhinitis, unspecified trigger     Rx / DC Orders   ED Discharge Orders     None        Note:  This document was prepared using Dragon voice recognition software and may include unintentional dictation errors.   Johnn Hai, PA-C 09/25/22 0902    Johnn Hai, PA-C 09/25/22 1308    Harvest Dark, MD 09/25/22 1455

## 2022-09-25 NOTE — Discharge Instructions (Addendum)
Continue with regular medic patient that you are primary care provider prescribed for you.  Call today to let them know that you do not feel like this medication is working and that you were seen in the emergency department and given a steroid.

## 2022-11-12 DIAGNOSIS — Z91013 Allergy to seafood: Secondary | ICD-10-CM | POA: Diagnosis not present

## 2023-04-24 ENCOUNTER — Emergency Department
Admission: EM | Admit: 2023-04-24 | Discharge: 2023-04-24 | Disposition: A | Discharge status: Home / Self Care | Payer: 59 | Attending: Emergency Medicine | Admitting: Emergency Medicine

## 2023-04-24 ENCOUNTER — Emergency Department: Payer: 59

## 2023-04-24 ENCOUNTER — Other Ambulatory Visit: Payer: Self-pay

## 2023-04-24 DIAGNOSIS — R0981 Nasal congestion: Secondary | ICD-10-CM | POA: Diagnosis present

## 2023-04-24 DIAGNOSIS — U071 COVID-19: Secondary | ICD-10-CM | POA: Diagnosis not present

## 2023-04-24 LAB — RESP PANEL BY RT-PCR (RSV, FLU A&B, COVID)  RVPGX2
Influenza A by PCR: NEGATIVE
Influenza B by PCR: NEGATIVE
Resp Syncytial Virus by PCR: NEGATIVE
SARS Coronavirus 2 by RT PCR: POSITIVE — AB

## 2023-04-24 MED ORDER — CETIRIZINE HCL 10 MG PO TABS: 10.0000 mg | ORAL_TABLET | Freq: Every day | ORAL | 0 refills | Status: AC

## 2023-04-24 MED ORDER — BENZONATATE 100 MG PO CAPS: 100.0000 mg | ORAL_CAPSULE | Freq: Three times a day (TID) | ORAL | 0 refills | Status: AC | PRN

## 2023-04-24 MED ORDER — FLUTICASONE PROPIONATE 50 MCG/ACT NA SUSP: 1.0000 | Freq: Every day | NASAL | 2 refills | Status: AC

## 2023-04-24 NOTE — ED Notes (Signed)
See triage notes. Patient c/o congestion and nasal pressure.

## 2023-04-24 NOTE — ED Provider Notes (Signed)
[facility] Provider Note  Patient Contact: 5:25 PM (approximate)   History   Nasal Congestion   HPI  Norvan Stettler is a 57 y.o. male presents to the emergency department with cough, nasal congestion, headache and bodyaches for the past 5 days.  Patient has also had low-grade fever.  No chest pain, chest tightness or abdominal pain.  No other alleviating measures have been attempted.      Physical Exam   Triage Vital Signs: ED Triage Vitals [04/24/23 1610]  Encounter Vitals Group     BP (!) 176/87     Systolic BP Percentile      Diastolic BP Percentile      Pulse Rate 70     Resp 18     Temp 98.4 F (36.9 C)     Temp Source Oral     SpO2 98 %     Weight      Height      Head Circumference      Peak Flow      Pain Score 3     Pain Loc      Pain Education      Exclude from Growth Chart     Most recent vital signs: Vitals:   04/24/23 1610  BP: (!) 176/87  Pulse: 70  Resp: 18  Temp: 98.4 F (36.9 C)  SpO2: 98%     Constitutional: Alert and oriented. Patient is lying supine. Eyes: Conjunctivae are normal. PERRL. EOMI. Head: Atraumatic. ENT:      Ears: Tympanic membranes are mildly injected with mild effusion bilaterally.       Nose: No congestion/rhinnorhea.      Mouth/Throat: Mucous membranes are moist. Posterior pharynx is mildly erythematous.  Hematological/Lymphatic/Immunilogical: No cervical lymphadenopathy.  Cardiovascular: Normal rate, regular rhythm. Normal S1 and S2.  Good peripheral circulation. Respiratory: Normal respiratory effort without tachypnea or retractions. Lungs CTAB. Good air entry to the bases with no decreased or absent breath sounds. Gastrointestinal: Bowel sounds 4 quadrants. Soft and nontender to palpation. No guarding or rigidity. No palpable masses. No distention. No CVA tenderness. Musculoskeletal: Full range of motion to all extremities. No gross deformities appreciated. Neurologic:  Normal speech  and language. No gross focal neurologic deficits are appreciated.  Skin:  Skin is warm, dry and intact. No rash noted. Psychiatric: Mood and affect are normal. Speech and behavior are normal. Patient exhibits appropriate insight and judgement.    ED Results / Procedures / Treatments   Labs (all labs ordered are listed, but only abnormal results are displayed) Labs Reviewed  RESP PANEL BY RT-PCR (RSV, FLU A&B, COVID)  RVPGX2 - Abnormal; Notable for the following components:      Result Value   SARS Coronavirus 2 by RT PCR POSITIVE (*)    All other components within normal limits       RADIOLOGY  I personally viewed and evaluated these images as part of my medical decision making, as well as reviewing the written report by the radiologist.  ED Provider Interpretation: Chest x-ray unremarkable.   PROCEDURES:  Critical Care performed: No  Procedures   MEDICATIONS ORDERED IN ED: Medications - No data to display   IMPRESSION / MDM / ASSESSMENT AND PLAN / ED COURSE  I reviewed the triage vital signs and the nursing notes.  Assessment and plan COVID-17 57 year old male presents to the emergency department with COVID-19-like symptoms.  He tested positive in the ER.  Chest x-ray unremarkable.  Will have patient start Tessalon Perles, Flonase and Zyrtec.  Recommended Tylenol and ibuprofen alternating for fever and bodyaches.  Return precautions were given to return with new or worsening symptoms.      FINAL CLINICAL IMPRESSION(S) / ED DIAGNOSES   Final diagnoses:  COVID-19     Rx / DC Orders   ED Discharge Orders          Ordered    benzonatate (TESSALON PERLES) 100 MG capsule  3 times daily PRN        04/24/23 1723    fluticasone (FLONASE) 50 MCG/ACT nasal spray  Daily        04/24/23 1723    cetirizine (ZYRTEC ALLERGY) 10 MG tablet  Daily        04/24/23 1723             Note:  This document was prepared using Dragon voice  recognition software and may include unintentional dictation errors.   Pia Mau Iraan, PA-C 04/24/23 1727    Jene Every, MD 04/24/23 [phone]

## 2023-04-24 NOTE — ED Triage Notes (Signed)
Pt to ED via POV from home. Pt reports went to UC after having congestion and tested negative for COVID. Pt reports he was prescribed tessalon pearls at [facility]. Pt states here today due to increased nasal pressure. Pt BP in triage 176/87 with hx of HTN. Pt reports took medication today. Pt with hx asthma.

## 2023-04-24 NOTE — Discharge Instructions (Signed)
You have been prescribed Tessalon Perles for cough. Please take Zyrtec and Flonase daily.

## 2024-03-04 ENCOUNTER — Ambulatory Visit: Admitting: Sleep Medicine

## 2024-03-20 ENCOUNTER — Ambulatory Visit: Admitting: Sleep Medicine

## 2024-05-22 ENCOUNTER — Ambulatory Visit

## 2024-05-22 DIAGNOSIS — L600 Ingrowing nail: Secondary | ICD-10-CM | POA: Diagnosis not present

## 2024-05-22 DIAGNOSIS — M79671 Pain in right foot: Secondary | ICD-10-CM | POA: Diagnosis not present

## 2024-05-22 MED ORDER — AMOXICILLIN-POT CLAVULANATE 875-125 MG PO TABS: 1.0000 | ORAL_TABLET | Freq: Two times a day (BID) | ORAL | 0 refills | Status: AC

## 2024-05-22 NOTE — Patient Instructions (Signed)

## 2024-05-22 NOTE — Progress Notes (Signed)
 Subjective:  Patient ID: Ronald Bridges, male    DOB: [birth year],  MRN: [id number]  Chief Complaint  Patient presents with   Ingrown Toenail    NP- open area on R 1st toe w/ puss inside toenail x 2 week. Pain scale 7/10. Has bled and oozed previously. Bilateral borders- no phenol    58 y.o. male presents with the above complaint.  Patient states that for the last 2 weeks, he has had developing infection to his right big toe.  He relates that there has been some purulent drainage, from the edges of the nails.  He feels well without any systemic signs of illness.  He is a diabetic and expresses that his control is moderate, over the last few days it has been consistently 130-150.   Review of Systems: Negative except as noted in the HPI. Denies N/V/F/Ch.  Past Medical History:  Diagnosis Date   Asthma    Diabetes mellitus without complication (HCC)    High cholesterol    Hypertension     Current Outpatient Medications:    albuterol  (VENTOLIN  HFA) 108 (90 Base) MCG/ACT inhaler, Inhale 1-2 puffs into the lungs every 6 (six) hours as needed for wheezing or shortness of breath., Disp: 18 g, Rfl: 0   atorvastatin (LIPITOR) 20 MG tablet, Take 20 mg by mouth daily., Disp: , Rfl:    beclomethasone (QVAR) 80 MCG/ACT inhaler, Inhale into the lungs 2 (two) times daily., Disp: , Rfl:    cetirizine  (ZYRTEC  ALLERGY) 10 MG tablet, Take 1 tablet (10 mg total) by mouth daily for 10 days., Disp: 30 tablet, Rfl: 0   empagliflozin (JARDIANCE) 10 MG TABS tablet, Take 10 mg by mouth daily., Disp: , Rfl:    fluticasone  (FLONASE ) 50 MCG/ACT nasal spray, Place 1 spray into both nostrils daily for 7 days., Disp: 9.9 mL, Rfl: 2   glipiZIDE (GLUCOTROL) 10 MG tablet, Take 10 mg by mouth daily before breakfast., Disp: , Rfl:    metFORMIN (GLUCOPHAGE) 500 MG tablet, Take by mouth 2 (two) times daily with a meal., Disp: , Rfl:    montelukast  (SINGULAIR ) 10 MG tablet, Take 1 tablet (10 mg total) by mouth at bedtime.,  Disp: 30 tablet, Rfl: 0   neomycin -polymyxin-pramoxine (NEOSPORIN PLUS) 1 % cream, Apply topically 2 (two) times daily., Disp: 14.2 g, Rfl: 0  Social History   Tobacco Use  Smoking Status Never  Smokeless Tobacco Never    Allergies  Allergen Reactions   Shellfish Allergy Anaphylaxis   Objective:  There were no vitals filed for this visit. There is no height or weight on file to calculate BMI. Constitutional Well developed. Well nourished.  Vascular Dorsalis pedis pulses palpable bilaterally. Posterior tibial pulses palpable bilaterally. Capillary refill normal to all digits.  No cyanosis or clubbing noted. Pedal hair growth normal.  Neurologic Normal speech. Oriented to person, place, and time. Epicritic sensation to light touch grossly present bilaterally.  Dermatologic Painful ingrowing nail at bilateral nail borders of the hallux nail right. Moderate erythema and edema distal to the hallux IPJ. Purulent drainage from bilateral nail borders. Pain to palpation. No other open wounds. No skin lesions.  Orthopedic: Normal joint ROM without pain or crepitus bilaterally. No visible deformities. No bony tenderness.   Radiographs: None Assessment:   1. Ingrown toenail of right foot with infection   2. Foot pain, right    Plan:  Patient was evaluated and treated and all questions answered.  Ingrown Nail, right -Discussed treatment options ranging from surgical  to conservative along with risks and benefits of each. Patient expresses understanding. Patient elects to proceed with minor surgery to remove ingrown toenail removal today. Consent reviewed and signed by patient. -Ingrown nail excised. See procedure note. - Emphasized importance of diabetic control during healing period. He expresses understanding and relates he has been well controlled over last few days. Most recent glucose in chart 175 from 09/25/22. - Due to purulence and DMII, abx ordered to help in resolution of  infection. Augmentin  875 mg, bid x 7 days placed to patient's pharmacy.  -Educated on post-procedure care including soaking. Written instructions provided and reviewed. -Patient to follow up in 2 weeks for nail check.  Procedure: Excision of Ingrown Toenail Location: Right 1st toe bilateral nail borders. Anesthesia: Lidocaine  1% plain; 1.5 mL and Marcaine 0.5% plain; 1.5 mL, digital block. Skin Prep: Betadine. Dressing: Silvadene; telfa; dry, sterile, compression dressing. Technique: Following skin prep, the toe was exsanguinated and a tourniquet was secured at the base of the toe. The affected nail border was freed, split with a nail splitter, and excised. The tourniquet was then removed and sterile dressing applied. Disposition: Patient tolerated procedure well. Patient to return in 2 weeks for follow-up.  Post op care: Keep dressing clean, dry, and intact for 24 hours before removing. Soak operative foot and redress BID as printed instructions describe. Patient educated on signs of infection, pt expresses understanding and will proceed for prompt medical care if signs of infection arise.   Return in about 2 weeks (around 06/05/2024).  Prentice Ovens, DPM AACFAS Fellowship Trained Podiatric Surgeon [facility]

## 2024-07-08 ENCOUNTER — Ambulatory Visit

## 2024-07-08 DIAGNOSIS — L600 Ingrowing nail: Secondary | ICD-10-CM

## 2024-07-08 DIAGNOSIS — M79671 Pain in right foot: Secondary | ICD-10-CM

## 2024-07-08 NOTE — Progress Notes (Signed)
 Subjective:  Patient ID: Ronald Bridges, male    DOB: 05-10-1966,  MRN: [id number]  Chief Complaint  Patient presents with   Routine Post Op    Follow up from last visit on 05/22/24 . He states everything is great. No pain/swelling    58 year old male presents for repeat evaluation of his right big toe nail avulsion site.  He denies any complications.  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  He denies any signs of infection.  Interval history 05/22/24: 58 y.o. male presents with the above complaint.  Patient states that for the last 2 weeks, he has had developing infection to his right big toe.  He relates that there has been some purulent drainage, from the edges of the nails.  He feels well without any systemic signs of illness.  He is a diabetic and expresses that his control is moderate, over the last few days it has been consistently 130-150.   Review of Systems: Negative except as noted in the HPI. Denies N/V/F/Ch.  Past Medical History:  Diagnosis Date   Asthma    Diabetes mellitus without complication (HCC)    High cholesterol    Hypertension     Current Outpatient Medications:    albuterol  (VENTOLIN  HFA) 108 (90 Base) MCG/ACT inhaler, Inhale 1-2 puffs into the lungs every 6 (six) hours as needed for wheezing or shortness of breath., Disp: 18 g, Rfl: 0   amoxicillin -clavulanate (AUGMENTIN ) 875-125 MG tablet, Take 1 tablet by mouth 2 (two) times daily., Disp: 14 tablet, Rfl: 0   atorvastatin (LIPITOR) 20 MG tablet, Take 20 mg by mouth daily., Disp: , Rfl:    beclomethasone (QVAR) 80 MCG/ACT inhaler, Inhale into the lungs 2 (two) times daily., Disp: , Rfl:    cetirizine  (ZYRTEC  ALLERGY) 10 MG tablet, Take 1 tablet (10 mg total) by mouth daily for 10 days., Disp: 30 tablet, Rfl: 0   empagliflozin (JARDIANCE) 10 MG TABS tablet, Take 10 mg by mouth daily., Disp: , Rfl:    fluticasone  (FLONASE ) 50 MCG/ACT nasal spray,  Place 1 spray into both nostrils daily for 7 days., Disp: 9.9 mL, Rfl: 2   glipiZIDE (GLUCOTROL) 10 MG tablet, Take 10 mg by mouth daily before breakfast., Disp: , Rfl:    metFORMIN (GLUCOPHAGE) 500 MG tablet, Take by mouth 2 (two) times daily with a meal., Disp: , Rfl:    montelukast  (SINGULAIR ) 10 MG tablet, Take 1 tablet (10 mg total) by mouth at bedtime., Disp: 30 tablet, Rfl: 0   neomycin -polymyxin-pramoxine (NEOSPORIN PLUS) 1 % cream, Apply topically 2 (two) times daily., Disp: 14.2 g, Rfl: 0  Social History   Tobacco Use  Smoking Status Never  Smokeless Tobacco Never    Allergies  Allergen Reactions   Shellfish Allergy Anaphylaxis   Objective:  There were no vitals filed for this visit. There is no height or weight on file to calculate BMI. Constitutional Well developed. Well nourished.  Vascular Dorsalis pedis pulses palpable bilaterally. Posterior tibial pulses palpable bilaterally. Capillary refill normal to all digits.  No cyanosis or clubbing noted. Pedal hair growth normal.  Neurologic Normal speech. Oriented to person, place, and time. Epicritic sensation to light touch grossly present bilaterally.  Dermatologic Healed avulsion sites on the right toenail without signs of complication or infection.  Small eschars remain, upon removing 1 of these eschars pinpoint bleeding was noted.  Other eschars removed without issue.  No other open wounds. No other open wounds. No skin lesions.  Orthopedic: Normal joint ROM without pain or crepitus bilaterally. No visible deformities. No bony tenderness.   Radiographs: None Assessment:   1. Foot pain, right   2. Ingrown toenail of right foot with infection     Plan:  Patient was evaluated and treated and all questions answered.  Ingrown Nail, right - Patient doing very well following partial nail avulsions on the right hallux.  I discussed with him his recovery and the resolution of infection that was present at last  visit. - After removing the eschar, I applied antibiotic ointment and a Band-Aid to the pinpoint bleeding area.  I discussed with him to wait 2 weeks before resuming normal activity in the pool. - return to clinic as needed  Prentice Ovens, [facility] Fellowship Trained Podiatric Surgeon [facility]

## 2024-07-22 ENCOUNTER — Ambulatory Visit: Admitting: Internal Medicine

## 2024-07-22 ENCOUNTER — Encounter: Payer: Self-pay | Admitting: Internal Medicine

## 2024-07-22 VITALS — BP 120/80 | HR 79 | Temp 98.4°F | Ht 72.0 in | Wt 244.2 lb

## 2024-07-22 DIAGNOSIS — J45909 Unspecified asthma, uncomplicated: Secondary | ICD-10-CM

## 2024-07-22 DIAGNOSIS — G4733 Obstructive sleep apnea (adult) (pediatric): Secondary | ICD-10-CM

## 2024-07-22 DIAGNOSIS — G4719 Other hypersomnia: Secondary | ICD-10-CM | POA: Diagnosis not present

## 2024-07-22 DIAGNOSIS — J4520 Mild intermittent asthma, uncomplicated: Secondary | ICD-10-CM

## 2024-07-22 DIAGNOSIS — R5381 Other malaise: Secondary | ICD-10-CM

## 2024-07-22 DIAGNOSIS — E669 Obesity, unspecified: Secondary | ICD-10-CM | POA: Diagnosis not present

## 2024-07-22 LAB — NITRIC OXIDE: Nitric Oxide: 67

## 2024-07-22 NOTE — Patient Instructions (Addendum)
       Recommend home sleep study to assess for sleep apnea Continue albuterol  as needed Continue Wixela as prescribed 2 puffs in the morning 2 puffs at night Rinse mouth after use Recommend pulmonary function testing to assess breathing

## 2024-07-22 NOTE — Progress Notes (Signed)
 Name: Ronald Bridges MRN: [id number] DOB: Apr 12, 1966    CHIEF COMPLAINT:  ASSESSMENT OF SLEEP APNEA EXCESSIVE DAYTIME SLEEPINESS Assessment of asthma   HISTORY OF PRESENT ILLNESS: Patient is seen today for problems and issues with sleep related to excessive daytime sleepiness Patient  has been having sleep problems for many years Patient has been having excessive daytime sleepiness for a long time Patient has been having extreme fatigue and tiredness, lack of energy +  very Loud snoring every night + morning headaches + Nonrefreshing sleep  Discussed sleep data and reviewed with patient.  Encouraged proper weight management.  Discussed driving precautions and its relationship with hypersomnolence.  Discussed operating dangerous equipment and its relationship with hypersomnolence.  Discussed sleep hygiene, and benefits of a fixed sleep waked time.  The importance of getting eight or more hours of sleep discussed with patient.  Discussed limiting the use of the computer and television before bedtime.  Decrease naps during the day, so night time sleep will become enhanced.  Limit caffeine, and sleep deprivation.  HTN, stroke, and heart failure are potential risk factors.   Patient is a non-smoker Nonalcoholic Patient exposed to vaping No significant triggers recognized at this point No pets Assessment of asthma Last week patient had asthma attack Given prednisone  antibiotics Patient feels a lot better Patient currently on Wixela 2 puffs in the morning 2 puffs at night Patient rinse his mouth after use Has has been using albuterol  as needed   no evidence of heart failure at this time No evidence or signs of infection at this time No respiratory distress No fevers, chills, nausea, vomiting, diarrhea No evidence of lower extremity edema No evidence hemoptysis   Assessment of ASTHMA FeNO  67  ppb-Elevated exhaled Nitric oxide testing is highly consistent with type II  inflammation -continue inhalers as prescribed -continue systemic steroids   PAST MEDICAL HISTORY :   has a past medical history of Asthma, Diabetes mellitus without complication (HCC), High cholesterol, and Hypertension.  has no past surgical history on file. Prior to Admission medications   Medication Sig Start Date End Date Taking? Authorizing Provider  albuterol  (VENTOLIN  HFA) 108 (90 Base) MCG/ACT inhaler Inhale 1-2 puffs into the lungs every 6 (six) hours as needed for wheezing or shortness of breath. 02/10/22   Jeannetta Channing CROME, NP  amoxicillin -clavulanate (AUGMENTIN ) 875-125 MG tablet Take 1 tablet by mouth 2 (two) times daily. 05/22/24   Magdalen Prentice PARAS, DPM  atorvastatin (LIPITOR) 20 MG tablet Take 20 mg by mouth daily.    [provider]  beclomethasone (QVAR) 80 MCG/ACT inhaler Inhale into the lungs 2 (two) times daily.    [provider]  cetirizine  (ZYRTEC  ALLERGY) 10 MG tablet Take 1 tablet (10 mg total) by mouth daily for 10 days. 04/24/23 05/04/23  Woods, Jaclyn M, PA-C  empagliflozin (JARDIANCE) 10 MG TABS tablet Take 10 mg by mouth daily.    [provider]  fluticasone  (FLONASE ) 50 MCG/ACT nasal spray Place 1 spray into both nostrils daily for 7 days. 04/24/23 05/01/23  Woods, Jaclyn M, PA-C  glipiZIDE (GLUCOTROL) 10 MG tablet Take 10 mg by mouth daily before breakfast.    [provider]  metFORMIN (GLUCOPHAGE) 500 MG tablet Take by mouth 2 (two) times daily with a meal.    [provider]  montelukast  (SINGULAIR ) 10 MG tablet Take 1 tablet (10 mg total) by mouth at bedtime. 02/10/22 03/12/22  Jeannetta Channing CROME, NP  neomycin -polymyxin-pramoxine (NEOSPORIN PLUS) 1 % cream  Apply topically 2 (two) times daily. 05/24/16   Claudene Tanda POUR, PA-C   Allergies  Allergen Reactions   Shellfish Allergy Anaphylaxis    FAMILY HISTORY:  family history is not on file. SOCIAL HISTORY:  reports that he has never smoked. He has never used smokeless  tobacco. He reports that he does not drink alcohol and does not use drugs.   BP 120/80   Pulse 79   Temp 98.4 F (36.9 C)   Ht 6' (1.829 m)   Wt 244 lb 3.2 oz (110.8 kg)   SpO2 98%   BMI 33.12 kg/m    Review of Systems: Gen:  Denies  fever, sweats, chills weight loss  HEENT: Denies blurred vision, double vision, ear pain, eye pain, hearing loss, nose bleeds, sore throat Cardiac:  No dizziness, chest pain or heaviness, chest tightness,edema, No JVD Resp:   No cough, -sputum production, -shortness of breath,+wheezing, -hemoptysis,  Other:  All other systems negative   Physical Examination:   General Appearance: No distress  EYES PERRLA, EOM intact.   NECK Supple, No JVD Pulmonary: normal breath sounds, No wheezing.  CardiovascularNormal S1,S2.  No m/r/g.   Abdomen: Benign, Soft, non-tender. Neurology UE/LE 5/5 strength, no focal deficits Ext pulses intact, cap refill intact ALL OTHER ROS ARE NEGATIVE     ASSESSMENT AND PLAN SYNOPSIS 58 year old pleasant African-American male Patient with signs and symptoms of excessive daytime sleepiness with probable underlying diagnosis of obstructive sleep apnea in the setting of obesity and deconditioned state, also with underlying reactive airways disease with asthma   Recommend HOME Sleep Study for definitve diagnosis  Obesity -recommend significant weight loss -recommend changing diet  Deconditioned state -Recommend increased daily activity and exercise  Assessment of asthma Continue Wixela as prescribed Continue albuterol  as needed Rinse mouth after use Avoid Allergens and Irritants Avoid secondhand smoke Avoid SICK contacts Recommend  Masking  when appropriate Recommend Keep up-to-date with vaccinations Recommend pulmonary function testing to assess breathing Continue Flonase  and Singulair  as prescribed for allergic rhinitis Continue current dose of antibiotics and prednisone  as prescribed by primary care  physician  MEDICATION ADJUSTMENTS/LABS AND TESTS ORDERED: Recommend Sleep Study PFTs Wixela Albuterol  Avoidance of triggers   CURRENT MEDICATIONS REVIEWED AT LENGTH WITH PATIENT TODAY   Patient  satisfied with Plan of action and management. All questions answered   Follow up 3 months   I spent a total of 61 minutes dedicated to the care of this patient on the date of this encounter to include pre-visit review of records, face-to-face time with the patient discussing conditions above, post visit ordering of testing, clinical documentation with the electronic health record, making appropriate referrals as documented, and communicating necessary information to the patient's healthcare team.     Nickolas Alm Cellar, M.D.  [facility] Pulmonary & Critical Care Medicine  Medical Director [facility]

## 2024-08-13 ENCOUNTER — Encounter

## 2024-08-13 DIAGNOSIS — G4733 Obstructive sleep apnea (adult) (pediatric): Secondary | ICD-10-CM

## 2024-08-22 ENCOUNTER — Other Ambulatory Visit: Payer: Self-pay

## 2024-08-22 ENCOUNTER — Emergency Department
Admission: EM | Admit: 2024-08-22 | Discharge: 2024-08-22 | Disposition: A | Discharge status: Home / Self Care | Attending: Emergency Medicine | Admitting: Emergency Medicine

## 2024-08-22 DIAGNOSIS — J45909 Unspecified asthma, uncomplicated: Secondary | ICD-10-CM | POA: Diagnosis not present

## 2024-08-22 DIAGNOSIS — R509 Fever, unspecified: Secondary | ICD-10-CM | POA: Diagnosis present

## 2024-08-22 DIAGNOSIS — R0602 Shortness of breath: Secondary | ICD-10-CM | POA: Diagnosis not present

## 2024-08-22 DIAGNOSIS — J069 Acute upper respiratory infection, unspecified: Secondary | ICD-10-CM | POA: Insufficient documentation

## 2024-08-22 LAB — RESP PANEL BY RT-PCR (RSV, FLU A&B, COVID)  RVPGX2
Influenza A by PCR: NEGATIVE
Influenza B by PCR: NEGATIVE
Resp Syncytial Virus by PCR: NEGATIVE
SARS Coronavirus 2 by RT PCR: NEGATIVE

## 2024-08-22 LAB — GROUP A STREP BY PCR: Group A Strep by PCR: NOT DETECTED

## 2024-08-22 MED ORDER — AZITHROMYCIN 250 MG PO TABS: ORAL_TABLET | ORAL | 0 refills | Status: AC

## 2024-08-22 NOTE — ED Provider Notes (Signed)
 "  [facility] Provider Note   Event Date/Time   First MD Initiated Contact with Patient 08/22/24 0901     (approximate) History  Nasal Congestion and Sore Throat  HPI Ronald Bridges is a 58 y.o. male with a past medical history of asthma who presents complaining of of nasal congestion, sore throat, subjective fever, and mild shortness of breath that is been progressive over the last 2 days.  Patient states wife is at home with similar symptoms.  Patient states that he is concerned as with his history of asthma he has felt more short of breath than usual and needed to use his inhaler for the first time in a few months last night. ROS: Patient currently denies any vision changes, tinnitus, difficulty speaking, facial droop, chest pain, abdominal pain, nausea/vomiting/diarrhea, dysuria, or weakness/numbness/paresthesias in any extremity   Physical Exam  Triage Vital Signs: ED Triage Vitals  Encounter Vitals Group     BP 08/22/24 0814 (!) 187/92     Girls Systolic BP Percentile --      Girls Diastolic BP Percentile --      Boys Systolic BP Percentile --      Boys Diastolic BP Percentile --      Pulse Rate 08/22/24 0811 (!) 56     Resp 08/22/24 0811 20     Temp 08/22/24 0811 98.9 F (37.2 C)     Temp Source 08/22/24 0811 Oral     SpO2 08/22/24 0811 94 %     Weight 08/22/24 0813 245 lb (111.1 kg)     Height 08/22/24 0813 6' 4 (1.93 m)     Head Circumference --      Peak Flow --      Pain Score 08/22/24 0812 3     Pain Loc --      Pain Education --      Exclude from Growth Chart --    Most recent vital signs: Vitals:   08/22/24 0811 08/22/24 0814  BP:  (!) 187/92  Pulse: (!) 56   Resp: 20   Temp: 98.9 F (37.2 C)   SpO2: 94%    General: Awake, oriented x4. CV:  Good peripheral perfusion. Resp:  Normal effort.  CTAB Abd:  No distention. Other:  Middle-aged overweight African-American male resting comfortably in no acute distress.  Erythematous nasal  turbinates bilaterally ED Results / Procedures / Treatments  Labs (all labs ordered are listed, but only abnormal results are displayed) Labs Reviewed  GROUP A STREP BY PCR  RESP PANEL BY RT-PCR (RSV, FLU A&B, COVID)  [id number]   PROCEDURES: Critical Care performed: No Procedures MEDICATIONS ORDERED IN ED: Medications - No data to display IMPRESSION / MDM / ASSESSMENT AND PLAN / ED COURSE  I reviewed the triage vital signs and the nursing notes.                             The patient is on the cardiac monitor to evaluate for evidence of arrhythmia and/or significant heart rate changes. Patient's presentation is most consistent with acute presentation with potential threat to life or bodily function. Patient is a 58 year old male with the above-stated past medical history who presents complaining of nasal congestion, sore throat, fever, and new shortness of breath in the setting of history of asthma. DDx: Asthma exacerbation, pneumonia, bacterial sinus infection, URI Plan: Group A strep, RVP  Patient's group A strep and COVID/flu/RSV negative at  this time.  Patient given a wait-and-see prescription for azithromycin  for his sinus symptoms.  Patient agrees with plan for discharge at this time with outpatient PCP follow-up as needed.  Patient given strict return precautions and all questions answered prior to discharge  Dispo: Discharge home with PCP follow-up   FINAL CLINICAL IMPRESSION(S) / ED DIAGNOSES   Final diagnoses:  Upper respiratory tract infection, unspecified type   Rx / DC Orders   ED Discharge Orders          Ordered    azithromycin  (ZITHROMAX  Z-PAK) 250 MG tablet        08/22/24 9093           Note:  This document was prepared using Dragon voice recognition software and may include unintentional dictation errors.   Belinda Bringhurst K, MD 08/22/24 [phone]  "

## 2024-08-22 NOTE — ED Triage Notes (Signed)
 Pt to ED POV for 2-3 days of nasal congestion, runny nose, sore throat, HA, body aches. Respirations are unlabored.

## 2024-09-02 DIAGNOSIS — R069 Unspecified abnormalities of breathing: Secondary | ICD-10-CM | POA: Diagnosis not present

## 2024-09-22 ENCOUNTER — Ambulatory Visit: Payer: Self-pay | Admitting: Internal Medicine

## 2024-10-19 ENCOUNTER — Ambulatory Visit: Admitting: Podiatry
